# Patient Record
Sex: Female | Born: 1950
Health system: Western US, Academic
[De-identification: ages and names within clinical notes are randomized; demographics above are authoritative.]

## PROBLEM LIST (undated history)

## (undated) DIAGNOSIS — D649 Anemia, unspecified: Secondary | ICD-10-CM

## (undated) DIAGNOSIS — F32A Depression, unspecified: Secondary | ICD-10-CM

## (undated) DIAGNOSIS — N200 Calculus of kidney: Secondary | ICD-10-CM

## (undated) DIAGNOSIS — J449 Chronic obstructive pulmonary disease, unspecified: Secondary | ICD-10-CM

## (undated) DIAGNOSIS — J45909 Unspecified asthma, uncomplicated: Secondary | ICD-10-CM

## (undated) DIAGNOSIS — I471 Supraventricular tachycardia, unspecified: Secondary | ICD-10-CM

## (undated) DIAGNOSIS — E7800 Pure hypercholesterolemia, unspecified: Secondary | ICD-10-CM

## (undated) DIAGNOSIS — Z889 Allergy status to unspecified drugs, medicaments and biological substances status: Secondary | ICD-10-CM

## (undated) DIAGNOSIS — Z8619 Personal history of other infectious and parasitic diseases: Secondary | ICD-10-CM

## (undated) DIAGNOSIS — E119 Type 2 diabetes mellitus without complications: Secondary | ICD-10-CM

## (undated) DIAGNOSIS — R011 Cardiac murmur, unspecified: Secondary | ICD-10-CM

## (undated) DIAGNOSIS — M1990 Unspecified osteoarthritis, unspecified site: Secondary | ICD-10-CM

## (undated) DIAGNOSIS — I10 Essential (primary) hypertension: Secondary | ICD-10-CM

## (undated) DIAGNOSIS — E785 Hyperlipidemia, unspecified: Secondary | ICD-10-CM

## (undated) DIAGNOSIS — Z9109 Other allergy status, other than to drugs and biological substances: Secondary | ICD-10-CM

## (undated) DIAGNOSIS — K219 Gastro-esophageal reflux disease without esophagitis: Secondary | ICD-10-CM

## (undated) DIAGNOSIS — F329 Major depressive disorder, single episode, unspecified: Secondary | ICD-10-CM

## (undated) HISTORY — DX: Supraventricular tachycardia, unspecified: I47.10

## (undated) HISTORY — DX: Unspecified asthma, uncomplicated: J45.909

## (undated) HISTORY — DX: Chronic obstructive pulmonary disease, unspecified: J44.9

## (undated) HISTORY — DX: Supraventricular tachycardia: I47.1

## (undated) HISTORY — DX: Unspecified osteoarthritis, unspecified site: M19.90

## (undated) HISTORY — PX: TUBAL LIGATION: SHX77

## (undated) HISTORY — DX: Other allergy status, other than to drugs and biological substances: Z91.09

## (undated) HISTORY — DX: Pure hypercholesterolemia, unspecified: E78.00

## (undated) HISTORY — DX: Essential (primary) hypertension: I10

## (undated) HISTORY — PX: TONSILLECTOMY: SUR1361

## (undated) HISTORY — PX: WISDOM TOOTH EXTRACTION: SHX21

## (undated) HISTORY — DX: Type 2 diabetes mellitus without complications: E11.9

## (undated) HISTORY — DX: Anemia, unspecified: D64.9

## (undated) HISTORY — DX: Gastro-esophageal reflux disease without esophagitis: K21.9

---

## 1988-08-22 HISTORY — PX: ABDOMINAL HYSTERECTOMY: SHX81

## 1999-06-18 ENCOUNTER — Ambulatory Visit (HOSPITAL_COMMUNITY): Admission: RE | Admit: 1999-06-18 | Discharge: 1999-06-18 | Payer: Self-pay | Admitting: Internal Medicine

## 2005-03-03 ENCOUNTER — Ambulatory Visit: Payer: Self-pay | Admitting: Internal Medicine

## 2005-12-29 ENCOUNTER — Ambulatory Visit: Payer: Self-pay | Admitting: Gastroenterology

## 2006-01-06 ENCOUNTER — Ambulatory Visit: Payer: Self-pay | Admitting: Gastroenterology

## 2006-04-05 ENCOUNTER — Ambulatory Visit: Payer: Self-pay | Admitting: Internal Medicine

## 2006-10-26 ENCOUNTER — Ambulatory Visit: Payer: Self-pay | Admitting: Cardiology

## 2006-11-13 ENCOUNTER — Encounter: Payer: Self-pay | Admitting: Cardiology

## 2006-11-13 ENCOUNTER — Ambulatory Visit: Payer: Self-pay

## 2006-11-13 ENCOUNTER — Encounter: Payer: Self-pay | Admitting: Internal Medicine

## 2006-11-15 ENCOUNTER — Ambulatory Visit: Payer: Self-pay | Admitting: Cardiology

## 2007-04-26 ENCOUNTER — Ambulatory Visit: Payer: Self-pay | Admitting: Internal Medicine

## 2007-06-29 ENCOUNTER — Ambulatory Visit: Payer: Self-pay | Admitting: Internal Medicine

## 2008-05-01 ENCOUNTER — Ambulatory Visit: Payer: Self-pay | Admitting: Internal Medicine

## 2008-05-29 ENCOUNTER — Ambulatory Visit: Payer: Self-pay | Admitting: Cardiology

## 2008-08-22 HISTORY — PX: LAPAROSCOPIC GASTRIC BANDING: SHX1100

## 2009-04-03 ENCOUNTER — Encounter (INDEPENDENT_AMBULATORY_CARE_PROVIDER_SITE_OTHER): Payer: Self-pay | Admitting: *Deleted

## 2009-08-20 ENCOUNTER — Ambulatory Visit: Payer: Self-pay | Admitting: Family Medicine

## 2009-09-05 ENCOUNTER — Ambulatory Visit: Payer: Self-pay

## 2010-10-05 ENCOUNTER — Ambulatory Visit: Payer: Self-pay | Admitting: Internal Medicine

## 2011-01-04 NOTE — Assessment & Plan Note (Signed)
[facility]                            CARDIOLOGY OFFICE NOTE   Glenda Zavala, Glenda Zavala                         MRN:          [id number]  DATE:05/29/2008                            DOB:          May 19, 1951    I was asked by Dr. Marcille Blanco to do a preop clearance on Glenda Zavala.  She is scheduled to have an elective Lap-Band in the near future.   I saw her initially on October 26, 2006, for an abnormal EKG.  At that time  because of her multiple cardiac risk factors including her weight  issues, we did a dobutamine echocardiogram.  This showed no stress-  induced ischemia.  It was a dobutamine-induced stress study.   She also had a history of a heart murmur which sounds like a benign  outflow murmur.  Echo did not show any significant valve disease.   Her past medical history is significant for hypertension, asthma,  hyperlipidemia, and osteoarthritis.   She is having no chest pain.  She does have some mild dyspnea on  exertion but is very limited.   Her meds today include:  1. Spiriva 18 mcg a day.  2. Fluticasone nasal spray daily.  3. Hydrochlorothiazide 25 mg a day.  4. Verapamil 240 mg a day.  5. Lisinopril 40 mg a day.  6. Metformin 1000 mg p.o. b.i.d.  7. Celebrex 200 mg a day.  8. Crestor 10 mg a day.  9. Iron.  10.B12 injection monthly.   She takes Asmanex p.r.n.   PHYSICAL EXAMINATION:  GENERAL:  Today, her blood pressure is 140/76,  pulse 72 and regular, weight is 354.  HEENT:  Grossly normal.  NECK:  Carotid upstrokes were equal bilaterally without bruits.  No JVD.  HEART:  Soft S1 and S2.  PMI could not be appreciated.  She has soft  systolic murmur along the left sternal border.  S2 splits.  LUNGS:  Clear.  ABDOMEN:  Obese.  Organomegaly could not be assessed.  EXTREMITIES:  Chronic edematous changes of the soft tissue, subcutaneous  fat.  Pulses are intact.   Electrocardiogram shows sinus rhythm with a rightward axis, low  voltage,  otherwise normal.   I have cleared Glenda Zavala for surgery.  She has a low operative risk in  my opinion for a Lap-Band procedure.  I have advised her to take her  hydrochlorothiazide, her verapamil, and her lisinopril the morning of  surgery.  If not, she can take it the night before.  She will obviously  need to hold her metformin because of the risk of acidosis.  Her Asmanex  is also important.   I have scheduled her for followup in 12 months to see the results.  She  is excited about having this and I wished her the best of success and  the best of luck.     Strothman C. Daleen Squibb, MD, [facility]  Electronically Signed    TCW/MedQ  DD: 05/29/2008  DT: 05/30/2008  Job #: [id number]   cc:   Lorin Picket A. Bovard, MD in [facility]

## 2011-01-07 NOTE — Assessment & Plan Note (Signed)
[facility]                            CARDIOLOGY OFFICE NOTE   Glenda, Zavala                         MRN:          [id number]  DATE:11/15/2006                            DOB:          [birth year]    Glenda Zavala returns today for followup of her stress dobutamine echo.   She required some atropine to get her heart rate up to target rate.  Her  peak heart rate was 144 beats per minute, blood pressure responded  appropriately.  Her stress images showed no evidence of any stress  induced ischemia.  She had some occasional PVCs and fusion beats  secondary to the dobutamine and atropine.  She did not have any chest  discomfort.   Her medications are unchanged.   I have had a good discussion with Glenda Zavala.  Though she may have some  nonobstructive block, she has no evidence of obstructive disease at this  point.  I have encouraged her to continue with her therapeutic lifestyle  changes and to take her medications.  We will plan on seeing her back on  a p.r.n. basis.     Lupe C. Daleen Squibb, MD, [facility]  Electronically Signed    TCW/MedQ  DD: 11/15/2006  DT: 11/15/2006  Job #: [id number]   cc:   Dale [place]

## 2011-01-07 NOTE — Assessment & Plan Note (Signed)
[facility]                            CARDIOLOGY OFFICE NOTE   SHARONA, ROVNER                         MRN:          [id number]  DATE:10/26/2006                            DOB:          [birth year]    I was asked by Dale [place] to evaluate Glenda Zavala with an abnormal  EKG.   I saw Ms. Glenda Zavala, apparently, 10 years ago.  She said we did a TEE,  which was normal.   She has had no symptoms of ischemia.  She is fairly sedentary, though  she has lost 50 pounds in the past 6 months.  Her risk factors are  diabetes x5 years, hypertension and hyperlipidemia, all being treated  successfully.   She does not smoke.  She weighs 338 pounds.   On recent evaluation by Dr. Lorin Picket, she was found to have poor R wave  progression across the anterior precordium with a PVC.  She has low  voltage and nonspecific ST segment changes otherwise.   PAST MEDICAL HISTORY:  SIGNIFICANT FOR NO KNOWN DYE ALLERGIES.   CURRENT MEDICATIONS:  1. Spiriva 18 mcg a day.  2. Fluticasone 50 mcg a day.  3. Asmanex 220 mcg a day.  4. Hydrochlorothiazide 25 mg a day.  5. Pravachol 40 mg a day.  6. Verapamil 240 mg a day.  7. Lisinopril 40 mg a day.  8. Metformin 1000 p.o. b.i.d.  9. Celebrex 200 mg a day.   She has 1 ounce of caffeinated beverage a day.  She does not drink  alcohol.   She has had a previous C-section x2, hysterectomy and 1 ovary removed.  She has had a tonsillectomy.   FAMILY HISTORY:  Negative for premature heart disease.   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  She is married and has 2 children and 2 grandchildren.   REVIEW OF SYSTEMS:  All systems reviewed and other HPI, positive for  some allergies, hay fever, history of asthma, menstrual dysfunction,  chronic arthritis and chronic bronchitis.   EXAMINATION:  She is extremely pleasant.  She is 5 feet 3 inches and weighs 353 pounds.  Her blood pressure is  132/80.  Pulse is 75 and regular.  HEENT:  Normocephalic and atraumatic.  PERRLA.  Extraocular movements  intact.  Sclerae are clear.  Facial symmetry is normal.  Dentition is  satisfactory.  NECK:  Supple.  Carotid upstrokes are equal bilaterally with probable  referred sounds bilaterally.  There is no JVD.  LUNGS:  Clear to auscultation.  HEART:  Reveals a nondisplaced PMI.  She has a systolic murmur along the  left sternal border that is 2/6.  S2 seems to split, but is difficult to  hear.  ABDOMINAL EXAM:  Obese, making organomegaly assessment impossible.  EXTREMITIES:  Reveal 1+ pitting edema.  She has chronic brawny and post  cellulitis changes in both lower extremities.  Her pulses were 2+/4+  dorsalis pedis, posterior tibial.  NEUROLOGIC:  Grossly intact.  SKIN:  Is otherwise intact and unremarkable.   ASSESSMENT:  1. Abnormal  electrocardiogram with a question of anteroseptal      myocardial infarction.  She has multiple cardiac risk factors,      including hypertension, diabetes, and hyperlipidemia, as well as      obesity.  2. Systolic murmur radiating to the neck.  She probably has some      degree of aortic sclerosis or stenosis.   RECOMMENDATIONS:  Dobutamine stress echo.  During the echo, we can  evaluate the aortic valve as well.  Hopefully, this will give [place] better  images than a Myoview.  I will see her back after the study to discuss  the findings and other suggestions.     [place] C. Daleen Squibb, MD, [facility]  Electronically Signed    TCW/MedQ  DD: 10/26/2006  DT: 10/26/2006  Job #: [id number]   cc:   Dale [place]

## 2012-02-14 ENCOUNTER — Ambulatory Visit: Payer: Self-pay | Admitting: Internal Medicine

## 2012-02-28 ENCOUNTER — Ambulatory Visit: Payer: Self-pay | Admitting: Cardiology

## 2012-06-13 ENCOUNTER — Telehealth: Payer: Self-pay | Admitting: Internal Medicine

## 2012-06-13 NOTE — Telephone Encounter (Signed)
Pt called to make appointment gave her 09/25/12.  Pt stated she is having a lot of problems with acid reflux.  She was up every hour last night throwing up acid.

## 2012-06-13 NOTE — Telephone Encounter (Signed)
If she was up all night and up every hour - needs eval now and then I can follow up afterwards.  Rec to Acute care for eval.  Thanks.

## 2012-06-14 NOTE — Telephone Encounter (Signed)
Left message on cell for patient to return call.  

## 2012-06-15 NOTE — Telephone Encounter (Signed)
Patient states that she is no better and that she is still having trouble sleeping. Patient is waiting for her appointment.

## 2012-06-19 NOTE — Telephone Encounter (Signed)
I can see her at 2:00 on 06/26/12.  If problems before or acute change - rec evaluation earlier.  Thanks.  Please schedule an appt.

## 2012-06-19 NOTE — Telephone Encounter (Signed)
This message was originally from 06/13/12.  I had messaged back and stated if up every night - to acute care for eval.  Per 06/15/12 message - states pt is waiting for appt.  Is she waiting on an appt with me?  Did she go to acute care?  Is she taking anything now for acid reflux?

## 2012-06-19 NOTE — Telephone Encounter (Signed)
Called patient at home to reevaluate her symptoms. Patient states that her condition is the same and she would like to see you as soon as you can work her in.

## 2012-06-20 ENCOUNTER — Telehealth: Payer: Self-pay | Admitting: Internal Medicine

## 2012-06-20 NOTE — Telephone Encounter (Signed)
Refill request for spiriva handihaler 18 mcg inh cap #30 Sig: inhale contents of one capsule once a day as directed Patient has an appointment on 09/27/12

## 2012-06-20 NOTE — Telephone Encounter (Signed)
Left message for patient to return call.

## 2012-06-20 NOTE — Telephone Encounter (Signed)
Left message for pt to call office

## 2012-06-21 NOTE — Telephone Encounter (Signed)
Dr. Lorin Picket called prescription into pharmacy.

## 2012-06-22 ENCOUNTER — Other Ambulatory Visit: Payer: Self-pay | Admitting: *Deleted

## 2012-06-22 MED ORDER — TIOTROPIUM BROMIDE MONOHYDRATE 18 MCG IN CAPS: 18.0000 ug | ORAL_CAPSULE | Freq: Every day | RESPIRATORY_TRACT | Status: DC

## 2012-06-22 NOTE — Telephone Encounter (Signed)
Left message for pt to call office

## 2012-06-22 NOTE — Telephone Encounter (Signed)
Left message for pt to call office dr Lorin Picket would like to see her 11/4 @ 11:30

## 2012-06-22 NOTE — Telephone Encounter (Signed)
Prescription faxed in to pharmacy and patient notified.

## 2012-06-25 NOTE — Telephone Encounter (Signed)
When you are able - please put her in the 06/28/12 appt - at 8:30.  Thanks.

## 2012-06-25 NOTE — Telephone Encounter (Signed)
Pt is in class today. Monday is not a good day. Any other day is ok Pt still having problems with gerd.   Pt has tried nexium and protonix  & Tagamet Pt is now taking oprazlam for 4 days this is helping a little

## 2012-06-25 NOTE — Telephone Encounter (Signed)
Left message for pt to call office

## 2012-06-26 NOTE — Telephone Encounter (Signed)
Pt aware of appointment 11/7 @ 8:30

## 2012-06-27 ENCOUNTER — Encounter: Payer: Self-pay | Admitting: Internal Medicine

## 2012-06-28 ENCOUNTER — Ambulatory Visit (INDEPENDENT_AMBULATORY_CARE_PROVIDER_SITE_OTHER): Payer: PRIVATE HEALTH INSURANCE | Admitting: Internal Medicine

## 2012-06-28 ENCOUNTER — Encounter: Payer: Self-pay | Admitting: Internal Medicine

## 2012-06-28 VITALS — BP 128/78 | HR 55 | Temp 99.1°F | Ht 64.0 in | Wt 240.0 lb

## 2012-06-28 DIAGNOSIS — I10 Essential (primary) hypertension: Secondary | ICD-10-CM

## 2012-06-28 DIAGNOSIS — E119 Type 2 diabetes mellitus without complications: Secondary | ICD-10-CM

## 2012-06-28 DIAGNOSIS — J45909 Unspecified asthma, uncomplicated: Secondary | ICD-10-CM

## 2012-06-28 DIAGNOSIS — M1990 Unspecified osteoarthritis, unspecified site: Secondary | ICD-10-CM

## 2012-06-28 DIAGNOSIS — K219 Gastro-esophageal reflux disease without esophagitis: Secondary | ICD-10-CM

## 2012-06-28 DIAGNOSIS — D649 Anemia, unspecified: Secondary | ICD-10-CM

## 2012-06-28 DIAGNOSIS — F329 Major depressive disorder, single episode, unspecified: Secondary | ICD-10-CM

## 2012-06-28 DIAGNOSIS — E7800 Pure hypercholesterolemia, unspecified: Secondary | ICD-10-CM

## 2012-06-28 DIAGNOSIS — M129 Arthropathy, unspecified: Secondary | ICD-10-CM

## 2012-06-28 NOTE — Patient Instructions (Addendum)
It was nice seeing you today.  I am sorry you have not been feeling well.  I want you to continue to take your omeprazole 30 minutes before breakfast and add an omeprazole 30 minutes before your evening meal.  Continue ranitidine at night.  We will get an appt scheduled with Dr Marva Panda.

## 2012-07-13 ENCOUNTER — Telehealth: Payer: Self-pay | Admitting: Internal Medicine

## 2012-07-13 LAB — HM DIABETES EYE EXAM

## 2012-07-13 NOTE — Telephone Encounter (Signed)
crestor 5mg  tab #90 Take one tablet daily at bedtime

## 2012-07-13 NOTE — Telephone Encounter (Signed)
Med refill Hydrochlorothiazide 25mg  tab #90 Take one tablet by mouth every day [facility] pharmacy

## 2012-07-15 ENCOUNTER — Encounter: Payer: Self-pay | Admitting: Internal Medicine

## 2012-07-15 ENCOUNTER — Telehealth: Payer: Self-pay | Admitting: Internal Medicine

## 2012-07-15 DIAGNOSIS — I10 Essential (primary) hypertension: Secondary | ICD-10-CM | POA: Insufficient documentation

## 2012-07-15 DIAGNOSIS — M1990 Unspecified osteoarthritis, unspecified site: Secondary | ICD-10-CM | POA: Insufficient documentation

## 2012-07-15 DIAGNOSIS — E7800 Pure hypercholesterolemia, unspecified: Secondary | ICD-10-CM | POA: Insufficient documentation

## 2012-07-15 DIAGNOSIS — F320 Major depressive disorder, single episode, mild: Secondary | ICD-10-CM | POA: Insufficient documentation

## 2012-07-15 DIAGNOSIS — D649 Anemia, unspecified: Secondary | ICD-10-CM | POA: Insufficient documentation

## 2012-07-15 DIAGNOSIS — E119 Type 2 diabetes mellitus without complications: Secondary | ICD-10-CM | POA: Insufficient documentation

## 2012-07-15 DIAGNOSIS — M129 Arthropathy, unspecified: Secondary | ICD-10-CM | POA: Insufficient documentation

## 2012-07-15 DIAGNOSIS — J45909 Unspecified asthma, uncomplicated: Secondary | ICD-10-CM | POA: Insufficient documentation

## 2012-07-15 DIAGNOSIS — K219 Gastro-esophageal reflux disease without esophagitis: Secondary | ICD-10-CM | POA: Insufficient documentation

## 2012-07-15 DIAGNOSIS — F32A Depression, unspecified: Secondary | ICD-10-CM | POA: Insufficient documentation

## 2012-07-15 DIAGNOSIS — F329 Major depressive disorder, single episode, unspecified: Secondary | ICD-10-CM | POA: Insufficient documentation

## 2012-07-15 MED ORDER — HYDROCHLOROTHIAZIDE 25 MG PO TABS: 25.0000 mg | ORAL_TABLET | Freq: Every day | ORAL | Status: DC

## 2012-07-15 MED ORDER — ROSUVASTATIN CALCIUM 5 MG PO TABS: 5.0000 mg | ORAL_TABLET | Freq: Every day | ORAL | Status: DC

## 2012-07-15 NOTE — Assessment & Plan Note (Signed)
With worsening acid reflux, dysphagia and is s/p lap band - feels she needs GI evaluation.  Will refer back to Dr Marva Panda for evaluation for possible EGD.  Will increase prilosec to bid - take one before breakfast and one before supper.  Take the Ranitidine before bed.  Avoid eating late and avoid foods that aggravate.

## 2012-07-15 NOTE — Telephone Encounter (Signed)
rx sent to [facility] for crestor 5mg  #90 with 3 refills and hctz #90 with 3 refills.

## 2012-07-15 NOTE — Progress Notes (Signed)
Subjective:    Patient ID: Glenda Zavala, female    DOB: 10/09/1950, 61 y.o.   MRN: [id number]  HPI 61 year old female with past history of hypertension, diabetes, SVT and pulmonary function testing that revealed changes consistent with emphysema.  She comes in today for a scheduled follow up.  She has been having increased problems with acid reflux.  She is s/p lap band.  Describes a burning sensation and decreased appetite.  Has adjusted her meals.  Feels as if food is getting stuck.  Symptoms worse at night.  She also had noticed increased drainage and cough.  Some occasional emesis related to this.  Taking an antihistamine at night to try to help with the increased drainage.  No drainage during the day.  Will feel acid up in her throat.  She is taking Omeprazole in the am.  Has seen Dr Marva Panda in the past.    She feels her breathing is stable.  Saw Dr Meredeth Ide.  Diagnosed with asthma.  Tried to adjust her medication for acid reflux.  Saw Dr Lady Gary.  Right heart cath was negative.  No chest pain or tightness.  Bowels stable.   Past Medical History  Diagnosis Date  . Hypertension   . Hypercholesterolemia   . Asthma   . Environmental allergies   . PSVT (paroxysmal supraventricular tachycardia)   . GERD (gastroesophageal reflux disease)   . Diabetes mellitus   . COPD (chronic obstructive pulmonary disease)   . Anemia   . Arthritis     Review of Systems Patient denies any headache, lightheadedness or dizziness.  Increased drainage as outlined.  No sinus pressure.  No chest pain, tightness or palpitations.  No increased shortness of breath.  drainage mostly a problem at night.  Increased acid reflux as outlined.  Decreased appetite.  Continued weight loss.  Some dysphagia.  Is s/p lab band.   Some emesis with the increased drainage.   No abdominal pain or cramping.  No bowel change, such as diarrhea, constipation, BRBPR or melana.  No urine change.        Objective:   Physical Exam Filed  Vitals:   06/28/12 0824  BP: 128/78  Pulse: 55  Temp: 99.1 F (40.40 C)   61 year old female in no acute distress.   HEENT:  Nares - clear.  OP- without lesions or erythema.  NECK:  Supple, nontender.  No audible bruit.   HEART:  Appears to be regular. LUNGS:  Without crackles or wheezing audible.  Respirations even and unlabored.   RADIAL PULSE:  Equal bilaterally.  ABDOMEN:  Soft, nontender.  No audible abdominal bruit.   EXTREMITIES:  No increased edema to be present.  Stable/improved.  No increased erythema or warmth.                  Assessment & Plan:  INCREASED DRAINAGE.  Thick drainage.  Bothers her at night.  Can continue Flonase and saline nasal flushes as directed.  I feel this is being aggravated by the worsening acid reflux.  Will treat acid reflux and see if her drainage improves.  Do not feel she needs antibiotics.    CARDIOVASCULAR.  Currently symptoms stable.  Recent right heart catheterization negative.   GI.  Colonoscopy 12/29/05.  She thinks she is due follow up.  With the increased acid reflux, dysphagia and recent lap band procedure - needs GI evaluation for possible EGD.  Treat acid reflux as outlined.  Follow.  She has  also had increased weight loss.  She is s/p lab band and has adjusted her diet.  With the increased acid reflux problems, she has also not been able to eat as well.  Again refer to GI for evaluation.    HEALTH MAINTENANCE.  Physical 01/11/12.  States she had her mammogram 6/13.  Obtain results.  Colonoscopy - above.  Refer to GI.

## 2012-07-15 NOTE — Assessment & Plan Note (Signed)
Has been seeing Dr Meredeth Ide.  Continue current inhaler regimen.  Breathing currently stable.

## 2012-07-15 NOTE — Assessment & Plan Note (Signed)
Has been seeing ortho.  Continue home exercise.  Needs to stop the celebrex especially with the current GI issues.  Use Tylenol.

## 2012-07-15 NOTE — Assessment & Plan Note (Signed)
On Crestor.  Has lost weight.  Cholesterol panel 05/25/12 revealed total cholesterol 155, triglycerides 62, HDL 52 and LDL91.  Liver panel wnl.  Same meds.

## 2012-07-15 NOTE — Assessment & Plan Note (Signed)
Has lost weight.  Continue same meds.  A1c 05/25/12 - 6.4.  Follow.

## 2012-07-15 NOTE — Assessment & Plan Note (Signed)
Blood pressure has been under good control.  Same meds.  Recent metabolic panel wnl.

## 2012-07-15 NOTE — Assessment & Plan Note (Signed)
Currently stable.  On Celexa.  Follow.

## 2012-07-15 NOTE — Assessment & Plan Note (Signed)
Hgb 05/25/12 - 10.8.  Stable.  Follow.  Remains on iron.  Follow.

## 2012-07-23 DIAGNOSIS — D649 Anemia, unspecified: Secondary | ICD-10-CM

## 2012-08-09 ENCOUNTER — Telehealth: Payer: Self-pay | Admitting: Internal Medicine

## 2012-08-09 NOTE — Telephone Encounter (Addendum)
One Touch Verio   Test blood glucose twice daily   # 50  Spirva Hanihaler 18 mcg inh cap  Inhale contents of one capsule once a day as directed  #30  Lisinopril 40 mg tab  Take one tablet by mouth every morning for blood pressure  #90   Verapamil HCL ER 240 mg TER   Taker one tablet each day  # 90

## 2012-08-13 MED ORDER — GLUCOSE BLOOD VI STRP: 1.0000 | ORAL_STRIP | Freq: Two times a day (BID) | Status: DC | PRN

## 2012-08-13 MED ORDER — VERAPAMIL HCL ER 240 MG PO TBCR: 240.0000 mg | EXTENDED_RELEASE_TABLET | Freq: Every day | ORAL | Status: DC

## 2012-08-13 MED ORDER — TIOTROPIUM BROMIDE MONOHYDRATE 18 MCG IN CAPS: 18.0000 ug | ORAL_CAPSULE | Freq: Every day | RESPIRATORY_TRACT | Status: DC

## 2012-08-13 MED ORDER — LISINOPRIL 40 MG PO TABS: 40.0000 mg | ORAL_TABLET | Freq: Every day | ORAL | Status: DC

## 2012-08-13 NOTE — Telephone Encounter (Signed)
Called prescription in to pharmacy 

## 2012-09-14 ENCOUNTER — Ambulatory Visit: Payer: Self-pay | Admitting: Gastroenterology

## 2012-09-14 DIAGNOSIS — K3189 Other diseases of stomach and duodenum: Secondary | ICD-10-CM | POA: Diagnosis not present

## 2012-09-14 DIAGNOSIS — R634 Abnormal weight loss: Secondary | ICD-10-CM | POA: Diagnosis not present

## 2012-09-14 DIAGNOSIS — R110 Nausea: Secondary | ICD-10-CM | POA: Diagnosis not present

## 2012-09-14 DIAGNOSIS — R1013 Epigastric pain: Secondary | ICD-10-CM | POA: Diagnosis not present

## 2012-09-14 DIAGNOSIS — K219 Gastro-esophageal reflux disease without esophagitis: Secondary | ICD-10-CM | POA: Diagnosis not present

## 2012-09-18 ENCOUNTER — Telehealth: Payer: Self-pay | Admitting: Internal Medicine

## 2012-09-18 NOTE — Telephone Encounter (Signed)
Left message for patient to return call.

## 2012-09-18 NOTE — Telephone Encounter (Signed)
Is she seeing her surgeon for this problem.  Does she need me to do anything.

## 2012-09-18 NOTE — Telephone Encounter (Signed)
Pt calling to inform Dr. Lorin Picket that her lap band has slipped and is causing severe acid reflux.  Pt has new pt appt 2/6 but is a pt of Dr. Roby Lofts from [place].  Pt informing Dr. Lorin Picket that she is to see Dr. Bufford Lope 2/5 and has had upper GI done; can call Christy Sartorius at [facility] [phone] with questions.

## 2012-09-21 NOTE — Telephone Encounter (Signed)
Called and left message for patient to call office;no answer and left message

## 2012-09-24 DIAGNOSIS — R1013 Epigastric pain: Secondary | ICD-10-CM | POA: Diagnosis not present

## 2012-09-24 DIAGNOSIS — K3189 Other diseases of stomach and duodenum: Secondary | ICD-10-CM | POA: Diagnosis not present

## 2012-09-26 NOTE — Telephone Encounter (Signed)
Patient stated that everything is fine.

## 2012-09-27 ENCOUNTER — Encounter: Payer: Self-pay | Admitting: Internal Medicine

## 2012-09-27 ENCOUNTER — Ambulatory Visit (INDEPENDENT_AMBULATORY_CARE_PROVIDER_SITE_OTHER): Payer: Medicare Other | Admitting: Internal Medicine

## 2012-09-27 VITALS — BP 124/70 | HR 63 | Temp 98.6°F | Ht 64.0 in | Wt 230.0 lb

## 2012-09-27 DIAGNOSIS — Z124 Encounter for screening for malignant neoplasm of cervix: Secondary | ICD-10-CM | POA: Insufficient documentation

## 2012-09-27 DIAGNOSIS — M1990 Unspecified osteoarthritis, unspecified site: Secondary | ICD-10-CM

## 2012-09-27 DIAGNOSIS — I10 Essential (primary) hypertension: Secondary | ICD-10-CM

## 2012-09-27 DIAGNOSIS — E119 Type 2 diabetes mellitus without complications: Secondary | ICD-10-CM

## 2012-09-27 DIAGNOSIS — F329 Major depressive disorder, single episode, unspecified: Secondary | ICD-10-CM

## 2012-09-27 DIAGNOSIS — D649 Anemia, unspecified: Secondary | ICD-10-CM

## 2012-09-27 DIAGNOSIS — Z1151 Encounter for screening for human papillomavirus (HPV): Secondary | ICD-10-CM | POA: Insufficient documentation

## 2012-09-27 DIAGNOSIS — K219 Gastro-esophageal reflux disease without esophagitis: Secondary | ICD-10-CM

## 2012-09-27 DIAGNOSIS — M129 Arthropathy, unspecified: Secondary | ICD-10-CM

## 2012-09-27 DIAGNOSIS — E7800 Pure hypercholesterolemia, unspecified: Secondary | ICD-10-CM

## 2012-09-27 DIAGNOSIS — Z139 Encounter for screening, unspecified: Secondary | ICD-10-CM

## 2012-09-27 DIAGNOSIS — J45909 Unspecified asthma, uncomplicated: Secondary | ICD-10-CM

## 2012-09-27 LAB — CBC WITH DIFFERENTIAL/PLATELET
Basophils Absolute: 0 10*3/uL (ref 0.0–0.1)
Eosinophils Absolute: 0.4 10*3/uL (ref 0.0–0.7)
Hemoglobin: 9.9 g/dL — ABNORMAL LOW (ref 12.0–15.0)
Lymphocytes Relative: 21.7 % (ref 12.0–46.0)
Lymphs Abs: 1.7 10*3/uL (ref 0.7–4.0)
MCHC: 31.9 g/dL (ref 30.0–36.0)
Monocytes Relative: 8.8 % (ref 3.0–12.0)
Neutro Abs: 5.1 10*3/uL (ref 1.4–7.7)
Platelets: 315 10*3/uL (ref 150.0–400.0)
RDW: 15.5 % — ABNORMAL HIGH (ref 11.5–14.6)

## 2012-09-27 LAB — LIPID PANEL
Cholesterol: 185 mg/dL (ref 0–200)
LDL Cholesterol: 119 mg/dL — ABNORMAL HIGH (ref 0–99)
Triglycerides: 57 mg/dL (ref 0.0–149.0)

## 2012-09-27 LAB — HEPATIC FUNCTION PANEL
ALT: 11 U/L (ref 0–35)
AST: 8 U/L (ref 0–37)
Albumin: 3.6 g/dL (ref 3.5–5.2)
Alkaline Phosphatase: 54 U/L (ref 39–117)
Bilirubin, Direct: 0.1 mg/dL (ref 0.0–0.3)
Total Protein: 6.3 g/dL (ref 6.0–8.3)

## 2012-09-27 LAB — BASIC METABOLIC PANEL
BUN: 21 mg/dL (ref 6–23)
Chloride: 101 mEq/L (ref 96–112)
Creatinine, Ser: 0.8 mg/dL (ref 0.4–1.2)
GFR: 80.29 mL/min (ref >60.00)

## 2012-09-27 LAB — FERRITIN: Ferritin: 5.2 ng/mL — ABNORMAL LOW (ref 10.0–291.0)

## 2012-09-27 MED ORDER — CITALOPRAM HYDROBROMIDE 20 MG PO TABS: ORAL_TABLET | ORAL | Status: DC

## 2012-09-29 ENCOUNTER — Encounter: Payer: Self-pay | Admitting: Internal Medicine

## 2012-09-29 NOTE — Progress Notes (Signed)
  Subjective:    Patient ID: Glenda Zavala, female    DOB: 03-03-1951, 62 y.o.   MRN: [id number]  HPI 62 year old female with past history of hypertension, diabetes, SVT and pulmonary function testing that revealed changes consistent with emphysema.  She comes in today for a scheduled follow up.  She had been having increased emesis and inability to eat.  She saw Dr Imogene Burn.  Her lap band had slipped.  States she had all her fluid removed and symptoms immediately improved.  Feels better.  Able to eat now.  No problems swallowing.  No acid reflux.  No nausea or vomiting now.  Bowels stable.  She feels her breathing is stable.  Saw Dr Meredeth Ide.  Diagnosed with asthma.  Has seen Dr Lady Gary.  Right heart cath was negative.  No chest pain or tightness.     Past Medical History  Diagnosis Date  . Hypertension   . Hypercholesterolemia   . Asthma   . Environmental allergies   . PSVT (paroxysmal supraventricular tachycardia)   . GERD (gastroesophageal reflux disease)   . Diabetes mellitus   . COPD (chronic obstructive pulmonary disease)   . Anemia   . Arthritis     Review of Systems Patient denies any headache, lightheadedness or dizziness.  No sinus pressure.  No chest pain, tightness or palpitations.  No increased shortness of breath.   No acid reflux now.  No nauaea or vomiting now.  Continues to follow up with surgery regarding her lap band.  No abdominal pain.  Bowels stable.  States her blood pressure has been doing well.  Has seen Dr Gentry Fitz (11/13) - for her eyes.         Objective:   Physical Exam  Filed Vitals:   09/27/12 0805  BP: 124/70  Pulse: 63  Temp: 98.6 F (21 C)   62 year old female in no acute distress.   HEENT:  Nares - clear.  OP- without lesions or erythema.  NECK:  Supple, nontender.  No audible bruit.   HEART:  Appears to be regular. LUNGS:  Without crackles or wheezing audible.  Respirations even and unlabored.   RADIAL PULSE:  Equal bilaterally.  ABDOMEN:  Soft,  nontender.  No audible abdominal bruit.   EXTREMITIES:  No increased edema to be present.  Stable/improved.  No increased erythema or warmth.                  Assessment & Plan:  CARDIOVASCULAR.  Currently symptoms stable.  Recent right heart catheterization negative.   GI.  Colonoscopy 12/29/05.  Should be seeing GI regarding follow up.  Is s/p lap band.  Recently slipped.  Fluid removed.  Doing better now.  Eating.  No problems swallowing.  Continues follow up with surgery.      HEALTH MAINTENANCE.  Physical 01/11/12.  States she had her mammogram 6/13.  Obtain results.  Colonoscopy - above.  Referred to GI.

## 2012-09-29 NOTE — Assessment & Plan Note (Signed)
Recheck cbc to confirm stable.  

## 2012-09-29 NOTE — Assessment & Plan Note (Signed)
Stable

## 2012-09-29 NOTE — Assessment & Plan Note (Signed)
Sugars have been under good control.  Will check metabolic panel and a1c.  Up to date with eye checks.  Saw Dr Gentry Fitz 11/13.

## 2012-09-29 NOTE — Assessment & Plan Note (Signed)
Blood pressure under good control.  Same medication regimen.  Follow.  Check metabolic panel.   

## 2012-09-29 NOTE — Assessment & Plan Note (Signed)
Breathing stable.  Follow.    

## 2012-09-29 NOTE — Assessment & Plan Note (Signed)
Symptoms doing well now.   See above.  Continues on omeprazole.  Follow.   

## 2012-09-29 NOTE — Assessment & Plan Note (Signed)
Continues on crestor.  Low choelsterol diet and exercise.  Check lipid profile and liver function.

## 2012-09-29 NOTE — Assessment & Plan Note (Signed)
Still with increased stress.  Mother-n-law living with her.  Has alzheimers.  Feels she may need to increased her citalopram.  Will increased to 10mg  - 1 1/2 tablets q day.  Follow.  Notify me if she needs something more.

## 2012-10-02 ENCOUNTER — Encounter: Payer: Self-pay | Admitting: *Deleted

## 2012-10-03 ENCOUNTER — Other Ambulatory Visit (HOSPITAL_COMMUNITY)
Admission: RE | Admit: 2012-10-03 | Discharge: 2012-10-03 | Disposition: A | Discharge status: Home / Self Care | Payer: No Typology Code available for payment source | Source: Ambulatory Visit | Attending: Internal Medicine | Admitting: Internal Medicine

## 2012-10-04 ENCOUNTER — Encounter: Payer: Self-pay | Admitting: Internal Medicine

## 2012-10-12 ENCOUNTER — Other Ambulatory Visit: Payer: Self-pay | Admitting: Internal Medicine

## 2012-10-15 NOTE — Telephone Encounter (Signed)
Sent in to pharmacy.  

## 2012-11-17 ENCOUNTER — Other Ambulatory Visit: Payer: Self-pay | Admitting: Internal Medicine

## 2012-11-21 NOTE — Telephone Encounter (Signed)
Rx sent in to pharmacy. 

## 2013-01-01 ENCOUNTER — Ambulatory Visit: Payer: Self-pay | Admitting: Gastroenterology

## 2013-01-01 DIAGNOSIS — Z79899 Other long term (current) drug therapy: Secondary | ICD-10-CM | POA: Diagnosis not present

## 2013-01-01 DIAGNOSIS — I10 Essential (primary) hypertension: Secondary | ICD-10-CM | POA: Diagnosis not present

## 2013-01-01 DIAGNOSIS — K219 Gastro-esophageal reflux disease without esophagitis: Secondary | ICD-10-CM | POA: Diagnosis not present

## 2013-01-01 DIAGNOSIS — D1779 Benign lipomatous neoplasm of other sites: Secondary | ICD-10-CM | POA: Diagnosis not present

## 2013-01-01 DIAGNOSIS — K5730 Diverticulosis of large intestine without perforation or abscess without bleeding: Secondary | ICD-10-CM | POA: Diagnosis not present

## 2013-01-01 DIAGNOSIS — Q438 Other specified congenital malformations of intestine: Secondary | ICD-10-CM | POA: Diagnosis not present

## 2013-01-01 DIAGNOSIS — G4730 Sleep apnea, unspecified: Secondary | ICD-10-CM | POA: Diagnosis not present

## 2013-01-01 DIAGNOSIS — E669 Obesity, unspecified: Secondary | ICD-10-CM | POA: Diagnosis not present

## 2013-01-01 DIAGNOSIS — Z1211 Encounter for screening for malignant neoplasm of colon: Secondary | ICD-10-CM | POA: Diagnosis not present

## 2013-01-17 ENCOUNTER — Ambulatory Visit (INDEPENDENT_AMBULATORY_CARE_PROVIDER_SITE_OTHER): Payer: Medicare Other | Admitting: Internal Medicine

## 2013-01-17 ENCOUNTER — Encounter: Payer: Self-pay | Admitting: Internal Medicine

## 2013-01-17 VITALS — BP 120/80 | HR 56 | Temp 98.5°F | Ht 61.25 in | Wt 264.2 lb

## 2013-01-17 DIAGNOSIS — M25569 Pain in unspecified knee: Secondary | ICD-10-CM

## 2013-01-17 DIAGNOSIS — I10 Essential (primary) hypertension: Secondary | ICD-10-CM | POA: Diagnosis not present

## 2013-01-17 DIAGNOSIS — D649 Anemia, unspecified: Secondary | ICD-10-CM

## 2013-01-17 DIAGNOSIS — E119 Type 2 diabetes mellitus without complications: Secondary | ICD-10-CM | POA: Diagnosis not present

## 2013-01-17 DIAGNOSIS — Z9884 Bariatric surgery status: Secondary | ICD-10-CM

## 2013-01-17 DIAGNOSIS — F329 Major depressive disorder, single episode, unspecified: Secondary | ICD-10-CM

## 2013-01-17 DIAGNOSIS — E7800 Pure hypercholesterolemia, unspecified: Secondary | ICD-10-CM

## 2013-01-17 DIAGNOSIS — K219 Gastro-esophageal reflux disease without esophagitis: Secondary | ICD-10-CM

## 2013-01-17 DIAGNOSIS — M1990 Unspecified osteoarthritis, unspecified site: Secondary | ICD-10-CM

## 2013-01-17 DIAGNOSIS — M129 Arthropathy, unspecified: Secondary | ICD-10-CM

## 2013-01-17 MED ORDER — NYSTATIN 100000 UNIT/GM EX POWD: Freq: Two times a day (BID) | CUTANEOUS | Status: DC

## 2013-01-17 MED ORDER — NYSTATIN 100000 UNIT/GM EX CREA: TOPICAL_CREAM | Freq: Two times a day (BID) | CUTANEOUS | Status: DC

## 2013-01-20 ENCOUNTER — Encounter: Payer: Self-pay | Admitting: Internal Medicine

## 2013-01-20 DIAGNOSIS — Z9889 Other specified postprocedural states: Secondary | ICD-10-CM | POA: Insufficient documentation

## 2013-01-20 DIAGNOSIS — Z9884 Bariatric surgery status: Secondary | ICD-10-CM | POA: Insufficient documentation

## 2013-01-20 NOTE — Assessment & Plan Note (Signed)
Still with increased stress.  Mother-n-law living with her.  Has alzheimers. On Celexa 10mg  - 1 1/2 tablets q day.  Follow.  Notify me if she needs something more.  Discussed at length with her today.

## 2013-01-20 NOTE — Assessment & Plan Note (Signed)
Doing better now.  Eating without problems now.  No acid reflux.  Needs follow up with Dr Bufford Lope.

## 2013-01-20 NOTE — Assessment & Plan Note (Signed)
Blood pressure under good control.  Same medication regimen.  Follow.  Check metabolic panel.   

## 2013-01-20 NOTE — Assessment & Plan Note (Signed)
Recheck cbc to confirm stable.  Colonoscopy as outlined.

## 2013-01-20 NOTE — Assessment & Plan Note (Signed)
Sugars have been under good control.  Will check metabolic panel and a1c.  Up to date with eye checks.  Saw Dr Gentry Fitz 11/13.

## 2013-01-20 NOTE — Assessment & Plan Note (Signed)
Joints doing  better in warm weather.  Off Celebrex.  Desires referral to Dr Ernest Pine for evaluation of her chronic knee pain.

## 2013-01-20 NOTE — Assessment & Plan Note (Signed)
Symptoms doing well now.   See above.  Continues on omeprazole.  Follow.   

## 2013-01-20 NOTE — Progress Notes (Signed)
Subjective:    Patient ID: Glenda Zavala, female    DOB: 1951-02-17, 62 y.o.   MRN: [id number]  HPI 62 year old female with past history of hypertension, diabetes, SVT and pulmonary function testing that revealed changes consistent with emphysema.  She comes in today to follow up on these issues as well as for a complete physical exam.   She had been having increased emesis and inability to eat. She saw Dr Imogene Burn.  Her lap band had slipped.  States she had all her fluid removed and symptoms immediately improved.  Feels better.  Able to eat now.  No problems swallowing.  No acid reflux.  No nausea or vomiting.  Has had no problems lately.  Bowels stable.  She feels her breathing is stable.  Saw Dr Meredeth Ide.  Diagnosed with asthma.  Has seen Dr Lady Gary.  Right heart cath was negative.  No chest pain or tightness.  Needs a follow up appt with Dr Bufford Lope.  She is off her celebrex.  Joints do better when the weather is warm.  Ready to see Dr Ernest Pine regarding her knee.  Her sugars have been doing well.  AM sugars - 90s.  PM sugars - 80s.      Past Medical History  Diagnosis Date  . Hypertension   . Hypercholesterolemia   . Asthma   . Environmental allergies   . PSVT (paroxysmal supraventricular tachycardia)   . GERD (gastroesophageal reflux disease)   . Diabetes mellitus   . COPD (chronic obstructive pulmonary disease)   . Anemia   . Arthritis     Current Outpatient Prescriptions on File Prior to Visit  Medication Sig Dispense Refill  . acetaminophen (TYLENOL) 650 MG CR tablet Take 650 mg by mouth every 8 (eight) hours as needed.      . citalopram (CELEXA) 20 MG tablet Take 1 1/2 tablet q day  45 tablet  3  . cyanocobalamin (,VITAMIN B-12,) 1000 MCG/ML injection Inject 1,000 mcg into the muscle every 30 (thirty) days.      . Fe Fum-FePoly-Vit C-Vit B3 (INTEGRA) 62.5-62.5-40-3 MG CAPS TAKE ONE CAPSULE BY MOUTH DAILY  30 each  5  . fluticasone (FLONASE) 50 MCG/ACT nasal spray Place 2 sprays into the  nose daily.      Marland Kitchen glucose blood test strip 1 each by Other route 2 (two) times daily as needed for other.  50 each  0  . hydrochlorothiazide (HYDRODIURIL) 25 MG tablet Take 1 tablet (25 mg total) by mouth daily.  90 tablet  3  . levalbuterol (XOPENEX HFA) 45 MCG/ACT inhaler Inhale 2 puffs into the lungs every 6 (six) hours as needed.      . rosuvastatin (CRESTOR) 5 MG tablet Take 1 tablet (5 mg total) by mouth at bedtime.  90 tablet  3  . tiotropium (SPIRIVA HANDIHALER) 18 MCG inhalation capsule Place 1 capsule (18 mcg total) into inhaler and inhale daily.  30 capsule  1  . verapamil (CALAN-SR) 240 MG CR tablet Take 1 tablet (240 mg total) by mouth daily.  90 tablet  0  . celecoxib (CELEBREX) 200 MG capsule Take 200 mg by mouth 2 (two) times daily.      Marland Kitchen FeFum-FePo-FA-B Cmp-C-Zn-Mn-Cu (TANDEM PLUS) 162-115.2-1 MG CAPS Take one a day      . ferrous fumarate-iron polysaccharide complex (TANDEM) 162-115.2 MG CAPS Take 1 capsule by mouth daily with breakfast.      . HYDROcodone-acetaminophen (NORCO/VICODIN) 5-325 MG per tablet Take 1 tablet by  mouth daily as needed.      . montelukast (SINGULAIR) 10 MG tablet Take 10 mg by mouth daily.       No current facility-administered medications on file prior to visit.    Review of Systems Patient denies any headache, lightheadedness or dizziness.  No sinus pressure.  No chest pain, tightness or palpitations.  No increased shortness of breath.   No acid reflux now.  No nauaea or vomiting now.  Needs a follow up with surgery regarding her lap band.  No abdominal pain.  Bowels stable.  States her blood pressure has been doing well.  Has seen Dr Gentry Fitz (11/13) - for her eyes. Sugars as outlined.  Joints better in warm weather.  Desires referral to Dr Ernest Pine regarding her knee.         Objective:   Physical Exam  Filed Vitals:   01/17/13 1050  BP: 120/80  Pulse: 56  Temp: 98.5 F (36.9 C)   Pulse 54  62 year old female in no acute distress.   HEENT:   Nares- clear.  Oropharynx - without lesions. NECK:  Supple.  Nontender.  No audible bruit.  HEART:  Appears to be regular. LUNGS:  No crackles or wheezing audible.  Respirations even and unlabored.  RADIAL PULSE:  Equal bilaterally.    BREASTS:  No nipple discharge or nipple retraction present.  Could not appreciate any distinct nodules or axillary adenopathy.  ABDOMEN:  Soft, nontender.  Bowel sounds present and normal.  No audible abdominal bruit.  GU:  She declined pap since done last year.     EXTREMITIES:  No increased edema present.  DP pulses palpable and equal bilaterally.             Assessment & Plan:  CARDIOVASCULAR.  Currently symptoms stable.  Recent right heart catheterization negative.  Due to see Dr  Lady Gary - 7/14.    GI.  Colonoscopy 12/29/05.  Seeing GI regarding follow up.  Had a follow up colonoscopy as outlined.       HEALTH MAINTENANCE.  Physical today.  States she had her mammogram 6/13.  Schedule a follow up mammogram when due.  Colonoscopy - above.

## 2013-01-20 NOTE — Assessment & Plan Note (Signed)
Breathing stable.  Follow.    

## 2013-01-20 NOTE — Assessment & Plan Note (Signed)
Continues on crestor.  Low choelsterol diet and exercise.  Check lipid profile and liver function.

## 2013-01-21 ENCOUNTER — Other Ambulatory Visit: Payer: Self-pay | Admitting: Internal Medicine

## 2013-02-01 ENCOUNTER — Encounter: Payer: Self-pay | Admitting: Emergency Medicine

## 2013-02-15 ENCOUNTER — Other Ambulatory Visit: Payer: Self-pay | Admitting: Internal Medicine

## 2013-02-20 ENCOUNTER — Encounter: Payer: Self-pay | Admitting: Internal Medicine

## 2013-02-27 ENCOUNTER — Other Ambulatory Visit: Payer: Self-pay | Admitting: Internal Medicine

## 2013-02-27 DIAGNOSIS — I10 Essential (primary) hypertension: Secondary | ICD-10-CM | POA: Diagnosis not present

## 2013-02-27 DIAGNOSIS — E782 Mixed hyperlipidemia: Secondary | ICD-10-CM | POA: Diagnosis not present

## 2013-02-27 DIAGNOSIS — J449 Chronic obstructive pulmonary disease, unspecified: Secondary | ICD-10-CM | POA: Diagnosis not present

## 2013-02-27 DIAGNOSIS — J810 Acute pulmonary edema: Secondary | ICD-10-CM | POA: Diagnosis not present

## 2013-03-07 DIAGNOSIS — M1710 Unilateral primary osteoarthritis, unspecified knee: Secondary | ICD-10-CM | POA: Diagnosis not present

## 2013-03-20 ENCOUNTER — Encounter: Payer: Self-pay | Admitting: Internal Medicine

## 2013-04-06 ENCOUNTER — Other Ambulatory Visit: Payer: Self-pay | Admitting: Internal Medicine

## 2013-04-11 ENCOUNTER — Encounter: Payer: Self-pay | Admitting: *Deleted

## 2013-05-24 ENCOUNTER — Ambulatory Visit: Payer: No Typology Code available for payment source | Admitting: Internal Medicine

## 2013-05-24 ENCOUNTER — Ambulatory Visit (INDEPENDENT_AMBULATORY_CARE_PROVIDER_SITE_OTHER): Payer: No Typology Code available for payment source | Admitting: Internal Medicine

## 2013-05-24 ENCOUNTER — Encounter: Payer: Self-pay | Admitting: Internal Medicine

## 2013-05-24 VITALS — BP 118/70 | HR 60 | Temp 98.9°F | Ht 61.25 in | Wt 278.5 lb

## 2013-05-24 DIAGNOSIS — E7800 Pure hypercholesterolemia, unspecified: Secondary | ICD-10-CM | POA: Diagnosis not present

## 2013-05-24 DIAGNOSIS — M129 Arthropathy, unspecified: Secondary | ICD-10-CM

## 2013-05-24 DIAGNOSIS — I10 Essential (primary) hypertension: Secondary | ICD-10-CM | POA: Diagnosis not present

## 2013-05-24 DIAGNOSIS — Z9884 Bariatric surgery status: Secondary | ICD-10-CM

## 2013-05-24 DIAGNOSIS — M1990 Unspecified osteoarthritis, unspecified site: Secondary | ICD-10-CM

## 2013-05-24 DIAGNOSIS — Z23 Encounter for immunization: Secondary | ICD-10-CM

## 2013-05-24 DIAGNOSIS — K219 Gastro-esophageal reflux disease without esophagitis: Secondary | ICD-10-CM

## 2013-05-24 DIAGNOSIS — E119 Type 2 diabetes mellitus without complications: Secondary | ICD-10-CM

## 2013-05-24 DIAGNOSIS — D649 Anemia, unspecified: Secondary | ICD-10-CM

## 2013-05-24 DIAGNOSIS — R0902 Hypoxemia: Secondary | ICD-10-CM

## 2013-05-24 DIAGNOSIS — J45909 Unspecified asthma, uncomplicated: Secondary | ICD-10-CM

## 2013-05-24 DIAGNOSIS — F329 Major depressive disorder, single episode, unspecified: Secondary | ICD-10-CM

## 2013-05-24 DIAGNOSIS — F32A Depression, unspecified: Secondary | ICD-10-CM

## 2013-05-24 LAB — HM DIABETES FOOT EXAM

## 2013-05-26 ENCOUNTER — Encounter: Payer: Self-pay | Admitting: Internal Medicine

## 2013-05-26 DIAGNOSIS — R0902 Hypoxemia: Secondary | ICD-10-CM | POA: Insufficient documentation

## 2013-05-26 NOTE — Assessment & Plan Note (Signed)
On 2 L O2 nasal cannula overnight.  States [employer] is requesting another overnight oximetry to qualify for her oxygen.  Will arrange.

## 2013-05-26 NOTE — Assessment & Plan Note (Signed)
Breathing stable.  Follow.    

## 2013-05-26 NOTE — Assessment & Plan Note (Signed)
Saw Dr Ernest Pine.  Planning for knee surgery 07/02/13.

## 2013-05-26 NOTE — Assessment & Plan Note (Signed)
Still with increased stress.  Mother-n-law living with her.  Has alzheimers.  Mother recently fell and fractured her hip.  Having to take care of her.  On Celexa 10mg  - 1 1/2 tablets q day.  Follow.  Notify me if she needs something more.

## 2013-05-26 NOTE — Assessment & Plan Note (Signed)
Sugars have been under good control.  Off metformin.  Recent a1c 6.0.  Follow.  Up to date with eye checks.  Saw Dr Gentry Fitz 11/13.

## 2013-05-26 NOTE — Assessment & Plan Note (Signed)
Symptoms doing well now.   See above.  Continues on omeprazole.  Follow.   

## 2013-05-26 NOTE — Assessment & Plan Note (Signed)
Colonoscopy 01/01/13 - as outlined.  On integra.  Hgb just checked and improved - 12.0.  Follow.  Continue iron.

## 2013-05-26 NOTE — Assessment & Plan Note (Signed)
Blood pressure under good control.  Same medication regimen.  Follow.  Recent metabolic panel wnl.  Will need close intra op and post op monitoring of her heart rate and blood pressure to avoid extremes.

## 2013-05-26 NOTE — Progress Notes (Signed)
Subjective:    Patient ID: Glenda Zavala, female    DOB: 07/12/1951, 62 y.o.   MRN: [id number]  HPI 62 year old female with past history of hypertension, diabetes, SVT and pulmonary function testing that revealed changes consistent with emphysema.  She comes in today for a scheduled follow up.  Her lap band had slipped.  States she had all her fluid removed and symptoms immediately improved. Since then has had no reoccurring GI problems.  Feels better.  Able to eat now.  No problems swallowing.  No acid reflux.  No nausea or vomiting.  Bowels stable.  She feels her breathing is stable. Saw Dr Meredeth Ide.  Diagnosed with asthma.  No sob.  Has seen Dr Lady Gary.  Right heart cath was negative.  No chest pain or tightness.  Saw Dr Ernest Pine regarding her knee.  Plans to have knee surgery 07/02/13.  Her sugars have been doing well.  AM sugars - 90 - 110 and  PM sugars - 135-140. She is off metformin.  Has been going to water aerobics.  Has started the  Cardio classes.  Doing well with these.  No chest pain or tightness.  Breathing stable during exercise.       Past Medical History  Diagnosis Date  . Hypertension   . Hypercholesterolemia   . Asthma   . Environmental allergies   . PSVT (paroxysmal supraventricular tachycardia)   . GERD (gastroesophageal reflux disease)   . Diabetes mellitus   . COPD (chronic obstructive pulmonary disease)   . Anemia   . Arthritis     Current Outpatient Prescriptions on File Prior to Visit  Medication Sig Dispense Refill  . acetaminophen (TYLENOL) 650 MG CR tablet Take 650 mg by mouth every 8 (eight) hours as needed.      . citalopram (CELEXA) 20 MG tablet TAKE 1+1/2 TABLETS BY MOUTH DAILY AS DIRECTED  45 tablet  2  . cyanocobalamin (,VITAMIN B-12,) 1000 MCG/ML injection Inject 1,000 mcg into the muscle every 30 (thirty) days.      . Fe Fum-FePoly-Vit C-Vit B3 (INTEGRA) 62.5-62.5-40-3 MG CAPS TAKE ONE CAPSULE BY MOUTH DAILY  30 each  5  . fluticasone (FLONASE) 50 MCG/ACT  nasal spray USE TWO SPRAYS IN EACH NOSTRIL EVERY DAY  16 g  5  . hydrochlorothiazide (HYDRODIURIL) 25 MG tablet Take 1 tablet (25 mg total) by mouth daily.  90 tablet  3  . levalbuterol (XOPENEX HFA) 45 MCG/ACT inhaler Inhale 2 puffs into the lungs every 6 (six) hours as needed.      Marland Kitchen lisinopril (PRINIVIL,ZESTRIL) 40 MG tablet TAKE ONE TABLET BY MOUTH EVERY MORNING FOR BLOOD PRESSURE  90 tablet  1  . metFORMIN (GLUCOPHAGE) 1000 MG tablet       . montelukast (SINGULAIR) 10 MG tablet Take 10 mg by mouth at bedtime as needed.       . nystatin (MYCOSTATIN) powder Apply topically 2 (two) times daily.  60 g  1  . nystatin cream (MYCOSTATIN) Apply topically 2 (two) times daily.  30 g  1  . ONETOUCH VERIO test strip TEST BLOOD GLUCOSE TWICE DAILY  50 each  10  . rosuvastatin (CRESTOR) 5 MG tablet Take 1 tablet (5 mg total) by mouth at bedtime.  90 tablet  3  . tiotropium (SPIRIVA HANDIHALER) 18 MCG inhalation capsule Place 1 capsule (18 mcg total) into inhaler and inhale daily.  30 capsule  1  . verapamil (CALAN-SR) 240 MG CR tablet TAKE ONE (1) TABLET  EACH DAY  90 tablet  1   No current facility-administered medications on file prior to visit.    Review of Systems Patient denies any headache, lightheadedness or dizziness.  No sinus pressure.  No chest pain, tightness or palpitations.  No increased shortness of breath.   No acid reflux.   No nauaea or vomiting now.  No abdominal pain.  Bowels stable.  States her blood pressure has been doing well.  Has seen Dr Gentry Fitz (11/13) - for her eyes.  Sugars as outlined.  Doing well off metformin.  Exercising.  Saw Dr Ernest Pine.  Planning for knee surgery 07/02/13.   States needs another overnight oximetry to renew her oxygen.       Objective:   Physical Exam  Filed Vitals:   05/24/13 1104  BP: 118/70  Pulse: 60  Temp: 98.9 F (37.2 C)   Blood pressure recheck:  26/35  62 year old female in no acute distress.   HEENT:  Nares- clear.  Oropharynx -  without lesions. NECK:  Supple.  Nontender.  No audible bruit.  HEART:  Appears to be regular. LUNGS:  No crackles or wheezing audible.  Respirations even and unlabored.  RADIAL PULSE:  Equal bilaterally.  ABDOMEN:  Soft, nontender.  Bowel sounds present and normal.  No audible abdominal bruit.     EXTREMITIES:  No increased edema present.  DP pulses palpable and equal bilaterally.             Assessment & Plan:  CARDIOVASCULAR.  Currently symptoms stable.  Recent right heart catheterization negative.  Just saw Dr Lady Gary in July.  Will need cardiac clearance from Dr Lady Gary.  Appears to be doing well.    GI.  Colonoscopy 12/29/05.   Had a follow up colonoscopy as outlined.    PRE OP.  Sees Dr Lady Gary.  Just evaluated in 7/14.  Will need cardiology clearance and recs prior to surgery.  She will also need close intra op and post op monitoring of her heart rate and blood pressure to avoid extremes.       HEALTH MAINTENANCE.  Physical last visit.   States she had her mammogram 6/13.  Needs her mammogram.  Colonoscopy 5/14 - lipoma and diverticulosis.

## 2013-05-26 NOTE — Assessment & Plan Note (Signed)
Continues on crestor.  Low choelsterol diet and exercise.  Lipid panel just checked 05/03/13 and revealed total cholesterol 184, triglycerides 57, HDL 68 and LDL 105.  Follow.  Liver panel wnl.

## 2013-05-26 NOTE — Assessment & Plan Note (Signed)
Doing better now.  Eating without problems now.  No acid reflux.  Needs follow up with Dr Bufford Lope.  Plans to follow up after her surgery.

## 2013-06-17 ENCOUNTER — Ambulatory Visit: Payer: No Typology Code available for payment source | Admitting: Internal Medicine

## 2013-06-19 ENCOUNTER — Ambulatory Visit (INDEPENDENT_AMBULATORY_CARE_PROVIDER_SITE_OTHER): Payer: No Typology Code available for payment source | Admitting: Internal Medicine

## 2013-06-19 ENCOUNTER — Encounter: Payer: Self-pay | Admitting: Internal Medicine

## 2013-06-19 VITALS — BP 130/70 | HR 60 | Temp 98.2°F | Ht 61.25 in | Wt 282.0 lb

## 2013-06-19 DIAGNOSIS — F439 Reaction to severe stress, unspecified: Secondary | ICD-10-CM

## 2013-06-19 DIAGNOSIS — E119 Type 2 diabetes mellitus without complications: Secondary | ICD-10-CM

## 2013-06-19 DIAGNOSIS — K219 Gastro-esophageal reflux disease without esophagitis: Secondary | ICD-10-CM

## 2013-06-19 DIAGNOSIS — F329 Major depressive disorder, single episode, unspecified: Secondary | ICD-10-CM

## 2013-06-19 DIAGNOSIS — Z733 Stress, not elsewhere classified: Secondary | ICD-10-CM

## 2013-06-19 DIAGNOSIS — Z9884 Bariatric surgery status: Secondary | ICD-10-CM

## 2013-06-19 DIAGNOSIS — R0902 Hypoxemia: Secondary | ICD-10-CM

## 2013-06-19 DIAGNOSIS — D649 Anemia, unspecified: Secondary | ICD-10-CM

## 2013-06-19 DIAGNOSIS — I10 Essential (primary) hypertension: Secondary | ICD-10-CM

## 2013-06-19 DIAGNOSIS — J45909 Unspecified asthma, uncomplicated: Secondary | ICD-10-CM

## 2013-06-19 NOTE — Assessment & Plan Note (Signed)
Still with increased stress.  Mother-n-law living with her.  Has alzheimers.  Mother recently fell and fractured her hip.  Having to take care of her.  Increased stress at home.   On celexa.  Will refer to psych for evaluation and further treatment.

## 2013-06-19 NOTE — Assessment & Plan Note (Addendum)
Breathing stable.  Follow.  Will need close monitoring of her pulmonary status during the peri op period.  Use the inhalers as directed.

## 2013-06-19 NOTE — Assessment & Plan Note (Signed)
Sugars have been under good control.  Off metformin.  Recent a1c 6.0.  Follow.  Up to date with eye checks.  Saw Dr Gentry Fitz 11/13.  Will need close monitoring of her sugars in the peri op period.

## 2013-06-19 NOTE — Progress Notes (Signed)
Subjective:    Patient ID: Glenda Zavala, female    DOB: [birth year], 62 y.o.   MRN: [id number]  HPI 62 year old female with past history of hypertension, diabetes, SVT and pulmonary function testing that revealed changes consistent with emphysema.  She comes in today as a work in to discuss the cancellation of her knee surgery.  She recently saw ortho and was planning to have her knee surgery in 11/14.  She had gained a significant amount of weight and it was decided to postpone the surgery.   She came in today to discuss a weight loss regimen.  She previously had problems with her lap band.   Her lap band had slipped.  States she had all her fluid removed.  Since then has had no reoccurring GI problems, but she has not been limited as far as what she is eating.  She does try to adjust her diet to modify her sugars and her sugars are under good control.  She was staying with her mother recently (taking care of her) and got out of her regular routine.  Is not exercising now.  She feels all of this has contributed to her weight gain.  We discussed a plan for weight loss.  She will return to her exercise routine as soon as her pool opens back up.  Is going to do better with her diet.  She is having no problems swallowing.  No acid reflux.  No nausea or vomiting.  Bowels stable.  She feels her breathing is stable. Saw Dr Meredeth Ide.  Diagnosed with asthma.  No sob.  Has seen Dr Lady Gary.  Right heart cath was negative.  No chest pain or tightness.  Her sugars have been doing well.   She is off metformin.   No chest pain or tightness.  Breathing stable during exercise.  Increased stress.  Has her mother-n-law living with her (has alzheimers). Family relies on her to "take care of everything".  Takes care of her grandchildren (a lot).  On citalopram.  She requested a referral to psych to discuss.      Past Medical History  Diagnosis Date  . Hypertension   . Hypercholesterolemia   . Asthma   . Environmental allergies    . PSVT (paroxysmal supraventricular tachycardia)   . GERD (gastroesophageal reflux disease)   . Diabetes mellitus   . COPD (chronic obstructive pulmonary disease)   . Anemia   . Arthritis     Current Outpatient Prescriptions on File Prior to Visit  Medication Sig Dispense Refill  . acetaminophen (TYLENOL) 650 MG CR tablet Take 650 mg by mouth every 8 (eight) hours as needed.      . Cholecalciferol (VITAMIN D) 2000 UNITS tablet Take 2,000 Units by mouth daily.      . citalopram (CELEXA) 20 MG tablet TAKE 1+1/2 TABLETS BY MOUTH DAILY AS DIRECTED  45 tablet  2  . cyanocobalamin (,VITAMIN B-12,) 1000 MCG/ML injection Inject 1,000 mcg into the muscle every 30 (thirty) days.      . Fe Fum-FePoly-Vit C-Vit B3 (INTEGRA) 62.5-62.5-40-3 MG CAPS TAKE ONE CAPSULE BY MOUTH DAILY  30 each  5  . fluticasone (FLONASE) 50 MCG/ACT nasal spray USE TWO SPRAYS IN EACH NOSTRIL EVERY DAY  16 g  5  . hydrochlorothiazide (HYDRODIURIL) 25 MG tablet Take 1 tablet (25 mg total) by mouth daily.  90 tablet  3  . levalbuterol (XOPENEX HFA) 45 MCG/ACT inhaler Inhale 2 puffs into the lungs every 6 (six)  hours as needed.      Marland Kitchen lisinopril (PRINIVIL,ZESTRIL) 40 MG tablet TAKE ONE TABLET BY MOUTH EVERY MORNING FOR BLOOD PRESSURE  90 tablet  1  . metFORMIN (GLUCOPHAGE) 1000 MG tablet       . montelukast (SINGULAIR) 10 MG tablet Take 10 mg by mouth at bedtime as needed.       . nystatin (MYCOSTATIN) powder Apply topically 2 (two) times daily.  60 g  1  . nystatin cream (MYCOSTATIN) Apply topically 2 (two) times daily.  30 g  1  . ONETOUCH VERIO test strip TEST BLOOD GLUCOSE TWICE DAILY  50 each  10  . rosuvastatin (CRESTOR) 5 MG tablet Take 1 tablet (5 mg total) by mouth at bedtime.  90 tablet  3  . tiotropium (SPIRIVA HANDIHALER) 18 MCG inhalation capsule Place 1 capsule (18 mcg total) into inhaler and inhale daily.  30 capsule  1  . verapamil (CALAN-SR) 240 MG CR tablet TAKE ONE (1) TABLET EACH DAY  90 tablet  1   No  current facility-administered medications on file prior to visit.    Review of Systems Patient denies any headache, lightheadedness or dizziness.  No sinus pressure.  No chest pain, tightness or palpitations. No increased shortness of breath.  Breathing stable.   No acid reflux.   No nauaea or vomiting.   No abdominal pain.  Bowels stable.  States her blood pressure has been doing well.  Sugars doing well off metformin.  Exercising.  Discussed weight loss.  Increased stress as outlined.       Objective:   Physical Exam  Filed Vitals:   06/19/13 1221  BP: 130/70  Pulse: 60  Temp: 98.2 F (43.15 C)   62 year old female in no acute distress.   HEENT:  Nares- clear.  Oropharynx - without lesions. NECK:  Supple.  Nontender.  No audible bruit.  HEART:  Appears to be regular. LUNGS:  No crackles or wheezing audible.  Respirations even and unlabored.  RADIAL PULSE:  Equal bilaterally.  ABDOMEN:  Soft, nontender.  Bowel sounds present and normal.  No audible abdominal bruit.     EXTREMITIES:  No increased edema present.  DP pulses palpable and equal bilaterally.   FEET:  Without lesions.            Assessment & Plan:  NEED FOR WEIGHT LOSS.  Discussed at length with her.  Plan in place.  She plans to adjust her diet and start back to her exercise.  Follow.  Will keep me posted.    CARDIOVASCULAR.  Currently symptoms stable.  Recent right heart catheterization negative.  Just saw Dr Lady Gary in July.  Will need cardiac clearance from Dr Lady Gary.  Appears to be doing well.    GI.  Colonoscopy 12/29/05.   Had a follow up colonoscopy as outlined.    PRE OP.  Sees Dr Lady Gary.  Just evaluated in 7/14.  Will need cardiology clearance and recs prior to surgery.  She will also need close intra op and post op monitoring of her heart rate and blood pressure to avoid extremes.  Surgery cancelled for now.  We discussed the importance of weight loss and diet adjustment.  Plan in place.  Will keep me posted.         HEALTH MAINTENANCE.  Physical 01/17/13.    States she had her mammogram 6/13.  Needs her mammogram.  Colonoscopy 5/14 - lipoma and diverticulosis.

## 2013-06-19 NOTE — Assessment & Plan Note (Signed)
Colonoscopy 01/01/13 - as outlined.  On integra.  Hgb just checked and improved - 12.0.  Follow.  Continue iron.  

## 2013-06-19 NOTE — Assessment & Plan Note (Signed)
On 2 L O2 nasal cannula overnight.    

## 2013-06-19 NOTE — Assessment & Plan Note (Signed)
Doing better now.  Eating without problems now.  No acid reflux.  Needs follow up with Dr Bufford Lope.  She plans to go ahead and f/u with him.  May have her port adjusted.

## 2013-06-19 NOTE — Assessment & Plan Note (Signed)
Symptoms doing well now.   See above.  Continues on omeprazole.  Follow.   

## 2013-06-19 NOTE — Assessment & Plan Note (Signed)
Blood pressure under good control.  Same medication regimen.  Follow.  Recent metabolic panel wnl.  Will need close intra op and post op monitoring of her heart rate and blood pressure to avoid extremes.   

## 2013-07-08 ENCOUNTER — Other Ambulatory Visit: Payer: Self-pay | Admitting: Internal Medicine

## 2013-07-10 ENCOUNTER — Other Ambulatory Visit: Payer: Self-pay | Admitting: Internal Medicine

## 2013-07-16 ENCOUNTER — Other Ambulatory Visit: Payer: Self-pay | Admitting: Internal Medicine

## 2013-08-05 ENCOUNTER — Other Ambulatory Visit: Payer: Self-pay | Admitting: Internal Medicine

## 2013-08-12 ENCOUNTER — Other Ambulatory Visit: Payer: Self-pay | Admitting: Internal Medicine

## 2013-08-13 ENCOUNTER — Encounter: Payer: Self-pay | Admitting: Internal Medicine

## 2013-08-28 ENCOUNTER — Ambulatory Visit: Payer: No Typology Code available for payment source | Admitting: Psychology

## 2013-09-10 ENCOUNTER — Ambulatory Visit (INDEPENDENT_AMBULATORY_CARE_PROVIDER_SITE_OTHER): Payer: No Typology Code available for payment source | Admitting: Psychology

## 2013-09-10 DIAGNOSIS — F4323 Adjustment disorder with mixed anxiety and depressed mood: Secondary | ICD-10-CM | POA: Diagnosis not present

## 2013-09-12 ENCOUNTER — Other Ambulatory Visit: Payer: Self-pay | Admitting: Internal Medicine

## 2013-09-18 ENCOUNTER — Ambulatory Visit: Payer: No Typology Code available for payment source | Admitting: Psychology

## 2013-09-25 ENCOUNTER — Encounter: Payer: Self-pay | Admitting: *Deleted

## 2013-09-26 ENCOUNTER — Ambulatory Visit: Payer: No Typology Code available for payment source | Admitting: Internal Medicine

## 2013-12-03 ENCOUNTER — Other Ambulatory Visit: Payer: Self-pay | Admitting: Internal Medicine

## 2013-12-03 NOTE — Telephone Encounter (Signed)
Electronic Rx request for Celexa, Pt has not been seen in the past 6 months and does not have any future appts schedule. Please advise.

## 2013-12-04 NOTE — Telephone Encounter (Signed)
I am ok to refill x 2, but she needs a physical scheduled after 01/17/14.  Please notify pt and then have Robin schedule a physical.  Thanks.

## 2013-12-04 NOTE — Telephone Encounter (Signed)
See below-pt notified that we will call her with a CPE appt

## 2013-12-12 NOTE — Telephone Encounter (Signed)
appintment 03/20/14  Pt aware

## 2014-01-02 ENCOUNTER — Other Ambulatory Visit: Payer: Self-pay | Admitting: Internal Medicine

## 2014-01-29 ENCOUNTER — Other Ambulatory Visit: Payer: Self-pay | Admitting: Internal Medicine

## 2014-01-29 NOTE — Telephone Encounter (Signed)
Appt 03/20/14

## 2014-01-30 ENCOUNTER — Telehealth: Payer: Self-pay | Admitting: Internal Medicine

## 2014-01-30 NOTE — Telephone Encounter (Signed)
Received notification that she was evaluated for lower extremity swelling and redness.  She was placed on lasix and levaquin.  Need to confirm legs are doing better.  If any persistent problems, will need to be reevaluated.  Also, her magnesium was low.  Need to make sure she is taking magnesium.  Needs to be rechecked and needs a1c and lipid panel checked.  If she wants to schedule to have checked her, let me know and I will place the order.

## 2014-01-30 NOTE — Telephone Encounter (Signed)
Pt states her legs are much improved.She followed up with that clinic on Monday. She was advised to stop the Lasix and resume her HCTZ. Has one pill left of the Levaquin. No persistent problems with her legs.She is taking magnesium 400 mg tid. She will return to [facility] to have her magnesium rechecked and her lipids and a1c checked as well.

## 2014-01-30 NOTE — Telephone Encounter (Signed)
Noted  

## 2014-03-20 ENCOUNTER — Encounter: Payer: Self-pay | Admitting: Internal Medicine

## 2014-03-20 ENCOUNTER — Ambulatory Visit (INDEPENDENT_AMBULATORY_CARE_PROVIDER_SITE_OTHER): Payer: No Typology Code available for payment source | Admitting: Internal Medicine

## 2014-03-20 VITALS — BP 110/78 | HR 90 | Temp 98.5°F | Ht 61.0 in | Wt 323.8 lb

## 2014-03-20 DIAGNOSIS — F329 Major depressive disorder, single episode, unspecified: Secondary | ICD-10-CM

## 2014-03-20 DIAGNOSIS — Z1239 Encounter for other screening for malignant neoplasm of breast: Secondary | ICD-10-CM

## 2014-03-20 DIAGNOSIS — D649 Anemia, unspecified: Secondary | ICD-10-CM

## 2014-03-20 DIAGNOSIS — E669 Obesity, unspecified: Secondary | ICD-10-CM

## 2014-03-20 DIAGNOSIS — Z0000 Encounter for general adult medical examination without abnormal findings: Secondary | ICD-10-CM

## 2014-03-20 DIAGNOSIS — E119 Type 2 diabetes mellitus without complications: Secondary | ICD-10-CM

## 2014-03-20 DIAGNOSIS — Z9884 Bariatric surgery status: Secondary | ICD-10-CM

## 2014-03-20 DIAGNOSIS — M25569 Pain in unspecified knee: Secondary | ICD-10-CM | POA: Diagnosis not present

## 2014-03-20 DIAGNOSIS — I499 Cardiac arrhythmia, unspecified: Secondary | ICD-10-CM

## 2014-03-20 DIAGNOSIS — I10 Essential (primary) hypertension: Secondary | ICD-10-CM | POA: Diagnosis not present

## 2014-03-20 DIAGNOSIS — F32A Depression, unspecified: Secondary | ICD-10-CM

## 2014-03-20 DIAGNOSIS — E7800 Pure hypercholesterolemia, unspecified: Secondary | ICD-10-CM

## 2014-03-20 DIAGNOSIS — F3289 Other specified depressive episodes: Secondary | ICD-10-CM

## 2014-03-20 DIAGNOSIS — R0902 Hypoxemia: Secondary | ICD-10-CM | POA: Diagnosis not present

## 2014-03-20 DIAGNOSIS — K219 Gastro-esophageal reflux disease without esophagitis: Secondary | ICD-10-CM

## 2014-03-20 DIAGNOSIS — J45909 Unspecified asthma, uncomplicated: Secondary | ICD-10-CM

## 2014-03-20 NOTE — Progress Notes (Signed)
Pre visit review using our clinic review tool, if applicable. No additional management support is needed unless otherwise documented below in the visit note. 

## 2014-03-23 ENCOUNTER — Encounter: Payer: Self-pay | Admitting: Internal Medicine

## 2014-03-23 DIAGNOSIS — M25569 Pain in unspecified knee: Secondary | ICD-10-CM | POA: Insufficient documentation

## 2014-03-23 DIAGNOSIS — E669 Obesity, unspecified: Secondary | ICD-10-CM | POA: Insufficient documentation

## 2014-03-23 NOTE — Assessment & Plan Note (Signed)
On 2 L O2 nasal cannula overnight.

## 2014-03-23 NOTE — Assessment & Plan Note (Signed)
Doing better now.  Eating without problems now.  No acid reflux.  She request referral to local bariatric surgeon.

## 2014-03-23 NOTE — Assessment & Plan Note (Signed)
Continues on crestor.  Low choelsterol diet and exercise.  Follow lipid panel and liver function.

## 2014-03-23 NOTE — Assessment & Plan Note (Signed)
Sugars have been under good control.  Off metformin.   Follow.  Up to date with eye checks.

## 2014-03-23 NOTE — Assessment & Plan Note (Signed)
Symptoms doing well now.   See above.  Continues on omeprazole.  Follow.

## 2014-03-23 NOTE — Assessment & Plan Note (Signed)
Increased knee pain.  Limiting her activity.  She has gained weight.  She request referral to Dr Wynelle Link for further evaluation and to discuss treatment options.

## 2014-03-23 NOTE — Assessment & Plan Note (Signed)
Blood pressure under good control.  Same medication regimen.  Follow.  Recent metabolic panel wnl.

## 2014-03-23 NOTE — Assessment & Plan Note (Signed)
Colonoscopy 01/01/13 - as outlined.  On integra.  Follow.  Continue iron.

## 2014-03-23 NOTE — Assessment & Plan Note (Signed)
Breathing stable.  Follow.    

## 2014-03-23 NOTE — Progress Notes (Signed)
Subjective:    Patient ID: Glenda Zavala, female    DOB: 1951/01/02, 63 y.o.   MRN: [id number]  HPI 63 year old female with past history of hypertension, diabetes, SVT and pulmonary function testing that revealed changes consistent with emphysema.  She comes in today to follow up on these issues as well as for a complete physical exam.  She reports that her knee is bothering her more.  Really limiting her walking and activity.  Has gained weight.  She had seen ortho previously and was planning to have her knee surgery in 11/14.  She had gained a significant amount of weight and it was decided to postpone the surgery.   She previously had problems with her lap band.   Her lap band had slipped.  States she had all her fluid removed.  Since then has had no reoccurring GI problems, but she has not been limited as far as what she is eating.  She does try to adjust her diet to modify her sugars and her sugars have been under good control.  She is having no problems swallowing.  No acid reflux.  No nausea or vomiting.  Bowels stable.  She feels her breathing is stable. Saw Dr Raul Del.  Diagnosed with asthma.  No sob.  Has seen Dr Ubaldo Glassing.  Right heart cath was negative.  No chest pain or tightness.  Her sugars have been doing well.   She is off metformin.   No chest pain or tightness.  Breathing stable.  Increased stress.  Has her mother-n-law living with her (has alzheimers). Family relies on her to "take care of everything".  Takes care of her grandchildren (a lot).  On citalopram.  She request referral to have another opinion about her knee surgery, since increased pain and limiting her activity.       Past Medical History  Diagnosis Date  . Hypertension   . Hypercholesterolemia   . Asthma   . Environmental allergies   . PSVT (paroxysmal supraventricular tachycardia)   . GERD (gastroesophageal reflux disease)   . Diabetes mellitus   . COPD (chronic obstructive pulmonary disease)   . Anemia   .  Arthritis     Current Outpatient Prescriptions on File Prior to Visit  Medication Sig Dispense Refill  . acetaminophen (TYLENOL) 650 MG CR tablet Take 650 mg by mouth every 8 (eight) hours as needed.      . Blood Glucose Monitoring Suppl (ONETOUCH VERIO IQ SYSTEM) W/DEVICE KIT AS DIRECTED  1 kit  0  . Cholecalciferol (VITAMIN D) 2000 UNITS tablet Take 2,000 Units by mouth daily.      . citalopram (CELEXA) 20 MG tablet TAKE 1&1/2 TABLETS BY MOUTH ONCE DAILY  45 tablet  2  . CRESTOR 5 MG tablet TAKE ONE TABLET BY MOUTH EVERY NIGHT AT BEDTIME  90 tablet  1  . cyanocobalamin (,VITAMIN B-12,) 1000 MCG/ML injection Inject 1,000 mcg into the muscle every 30 (thirty) days.      . Fe Fum-FePoly-Vit C-Vit B3 (INTEGRA) 62.5-62.5-40-3 MG CAPS TAKE ONE CAPSULE BY MOUTH DAILY  30 each  5  . fluticasone (FLONASE) 50 MCG/ACT nasal spray USE TWO SPRAYS IN EACH NOSTRIL EVERY DAY  16 g  5  . hydrochlorothiazide (HYDRODIURIL) 25 MG tablet TAKE ONE (1) TABLET BY MOUTH EVERY DAY  90 tablet  1  . lisinopril (PRINIVIL,ZESTRIL) 40 MG tablet TAKE ONE TABLET BY MOUTH EVERY MORNING FOR BLOOD PRESSURE  90 tablet  0  . metFORMIN (  GLUCOPHAGE) 1000 MG tablet TAKE ONE (1) TABLET BY MOUTH TWO (2) TIMES DAILY  60 tablet  1  . montelukast (SINGULAIR) 10 MG tablet Take 10 mg by mouth at bedtime as needed.       . nystatin (MYCOSTATIN) powder Apply topically 2 (two) times daily.  60 g  1  . nystatin cream (MYCOSTATIN) Apply topically 2 (two) times daily.  30 g  1  . ONETOUCH VERIO test strip TEST BLOOD GLUCOSE TWICE DAILY  50 each  10  . SPIRIVA HANDIHALER 18 MCG inhalation capsule INHALE CONTENTS OF ONE CAPSULE ONCE A DAY AS DIRECTED  30 capsule  5  . verapamil (CALAN-SR) 240 MG CR tablet TAKE ONE (1) TABLET EACH DAY  90 tablet  1  . XOPENEX HFA 45 MCG/ACT inhaler INHALE 2 PUFFS AS DIRECTED BEFORE EXERTION AND EVERY 4 HOURS AS NEEDED  15 g  2   No current facility-administered medications on file prior to visit.    Review of  Systems Patient denies any headache, lightheadedness or dizziness.  No sinus pressure.  No chest pain, tightness or palpitations. No increased shortness of breath.  Breathing stable.   No acid reflux.   No nauaea or vomiting.   No abdominal pain.  Bowels stable.  States her blood pressure has been doing well.  Sugars doing well off metformin.   Discussed weight loss. She is s/p lap band procedure.  See above.  She request referral to local surgeon.   Increased stress as outlined.  Increased knee pain as outlined.       Objective:   Physical Exam  Filed Vitals:   03/20/14 1436  BP: 110/78  Pulse: 90  Temp: 98.5 F (36.9 C)   Blood pressure recheck:  30/68  63 year old female in no acute distress.   HEENT:  Nares- clear.  Oropharynx - without lesions. NECK:  Supple.  Nontender.  No audible bruit.  HEART:  Appears to be regular. LUNGS:  No crackles or wheezing audible.  Respirations even and unlabored.  RADIAL PULSE:  Equal bilaterally.    BREASTS:  No nipple discharge or nipple retraction present.  Could not appreciate any distinct nodules or axillary adenopathy.  ABDOMEN:  Soft, nontender.  Bowel sounds present and normal.  No audible abdominal bruit.  GU:  Not performed.    EXTREMITIES:  No increased edema present.  Stable.  Some stasis changes.  No acute erythema.     FEET:  No lesions.        Assessment & Plan:  NEED FOR WEIGHT LOSS.  Discussed at length with her.  She plans to adjust her diet and start back to her exercise as tolerated.  Limited by her knee pain.  Follow.    CARDIOVASCULAR.  Currently symptoms stable.  Recent right heart catheterization negative.  Sees Dr Ubaldo Glassing.  Will need cardiac clearance from Dr Ubaldo Glassing before knee surgery.    GI.  Colonoscopy 12/29/05.   Had a follow up colonoscopy as outlined 5/14.     HEALTH MAINTENANCE.  Physical today.    Needs her mammogram.  Overdue.  Colonoscopy 5/14 - lipoma and diverticulosis.    I spent 25 minutes with the patient  and more than 50% of the time was spent in consultation regarding the above.

## 2014-03-23 NOTE — Assessment & Plan Note (Signed)
Discussed weight loss, diet and exercise.  Request referral to local bariatric surgeon.   Is s/p lap band.

## 2014-03-23 NOTE — Assessment & Plan Note (Signed)
Still with increased stress.  Mother-n-law living with her.  Has alzheimers.  Mother recently fell and fractured her hip.  Having to take care of her.  Increased stress at home.   On celexa.  Stable.

## 2014-04-24 ENCOUNTER — Other Ambulatory Visit: Payer: Self-pay | Admitting: Internal Medicine

## 2014-05-12 ENCOUNTER — Other Ambulatory Visit: Payer: Self-pay | Admitting: Internal Medicine

## 2014-05-28 ENCOUNTER — Telehealth: Payer: Self-pay | Admitting: Internal Medicine

## 2014-05-28 NOTE — Telephone Encounter (Signed)
Notify pt that I have placed the order for the referral to surgery to discuss weight loss options.  They should be contacting her with an appt.   Thanks.

## 2014-05-28 NOTE — Telephone Encounter (Signed)
Left message on pts VM advising referral has been placed.

## 2014-06-10 ENCOUNTER — Other Ambulatory Visit: Payer: Self-pay | Admitting: Internal Medicine

## 2014-06-20 ENCOUNTER — Ambulatory Visit: Payer: Self-pay | Admitting: Internal Medicine

## 2014-07-04 ENCOUNTER — Other Ambulatory Visit: Payer: Self-pay | Admitting: Internal Medicine

## 2014-07-10 DIAGNOSIS — Z23 Encounter for immunization: Secondary | ICD-10-CM | POA: Diagnosis not present

## 2014-07-18 ENCOUNTER — Other Ambulatory Visit: Payer: Self-pay | Admitting: Internal Medicine

## 2014-07-22 ENCOUNTER — Other Ambulatory Visit: Payer: Self-pay | Admitting: *Deleted

## 2014-07-22 MED ORDER — GLUCOSE BLOOD VI STRP: ORAL_STRIP | Status: DC

## 2014-07-22 MED ORDER — ACCU-CHEK SOFT TOUCH LANCETS MISC: Status: AC

## 2014-08-01 ENCOUNTER — Ambulatory Visit (INDEPENDENT_AMBULATORY_CARE_PROVIDER_SITE_OTHER): Payer: No Typology Code available for payment source | Admitting: Internal Medicine

## 2014-08-01 ENCOUNTER — Encounter: Payer: Self-pay | Admitting: Internal Medicine

## 2014-08-01 VITALS — BP 132/80 | HR 58 | Temp 97.4°F | Ht 61.0 in | Wt 315.8 lb

## 2014-08-01 DIAGNOSIS — Z9884 Bariatric surgery status: Secondary | ICD-10-CM

## 2014-08-01 DIAGNOSIS — Z6841 Body Mass Index (BMI) 40.0 and over, adult: Secondary | ICD-10-CM

## 2014-08-01 DIAGNOSIS — E7800 Pure hypercholesterolemia, unspecified: Secondary | ICD-10-CM

## 2014-08-01 DIAGNOSIS — F32A Depression, unspecified: Secondary | ICD-10-CM

## 2014-08-01 DIAGNOSIS — K219 Gastro-esophageal reflux disease without esophagitis: Secondary | ICD-10-CM | POA: Diagnosis not present

## 2014-08-01 DIAGNOSIS — L989 Disorder of the skin and subcutaneous tissue, unspecified: Secondary | ICD-10-CM

## 2014-08-01 DIAGNOSIS — E119 Type 2 diabetes mellitus without complications: Secondary | ICD-10-CM

## 2014-08-01 DIAGNOSIS — I10 Essential (primary) hypertension: Secondary | ICD-10-CM

## 2014-08-01 DIAGNOSIS — D649 Anemia, unspecified: Secondary | ICD-10-CM

## 2014-08-01 DIAGNOSIS — F329 Major depressive disorder, single episode, unspecified: Secondary | ICD-10-CM | POA: Diagnosis not present

## 2014-08-01 DIAGNOSIS — Z1239 Encounter for other screening for malignant neoplasm of breast: Secondary | ICD-10-CM

## 2014-08-01 MED ORDER — CITALOPRAM HYDROBROMIDE 40 MG PO TABS: 40.0000 mg | ORAL_TABLET | Freq: Every day | ORAL | Status: DC

## 2014-08-01 NOTE — Progress Notes (Signed)
Pre visit review using our clinic review tool, if applicable. No additional management support is needed unless otherwise documented below in the visit note. 

## 2014-08-01 NOTE — Progress Notes (Signed)
Subjective:    Patient ID: Glenda Zavala, female    DOB: 06/02/1951, 63 y.o.   MRN: [id number]  HPI 63 year old female with past history of hypertension, diabetes, SVT and pulmonary function testing that revealed changes consistent with emphysema.  She had seen ortho previously and was planning to have her knee surgery in 11/14.  She had gained a significant amount of weight and it was decided to postpone the surgery.   She previously had problems with her lap band.   Her lap band had slipped.  States she had all her fluid removed.  Since then has had no reoccurring GI problems, but she has not been limited as far as what she is eating.  She does try to adjust her diet to modify her sugars and her sugars have been under good control.  She is having no problems swallowing.  No acid reflux.  No nausea or vomiting.  Bowels stable.  She has been paying more attn to her diet lately and has lost some weight.  She feels her breathing is stable. Saw Dr Fleming.  Diagnosed with asthma.  No sob.  Has seen Dr Fath.  Right heart cath was negative.  No chest pain or tightness.  Her sugars have been doing well.   She is off metformin.   No chest pain or tightness.  Breathing stable.  Increased stress.  Has her mother-n-law living with her (has alzheimers).  Her mother had a stroke.  She is living with her (temporarily) and having to help take care of her.  Increased stress.  On citalopram.  She feels she needs to increase the dose. States her blood pressure has been doing well.  Blood pressure averaging 120-124/68-72.  Persistent skin lesions.     Past Medical History  Diagnosis Date  . Hypertension   . Hypercholesterolemia   . Asthma   . Environmental allergies   . PSVT (paroxysmal supraventricular tachycardia)   . GERD (gastroesophageal reflux disease)   . Diabetes mellitus   . COPD (chronic obstructive pulmonary disease)   . Anemia   . Arthritis     Current Outpatient Prescriptions on File Prior  to Visit  Medication Sig Dispense Refill  . acetaminophen (TYLENOL) 650 MG CR tablet Take 650 mg by mouth every 8 (eight) hours as needed.    . Blood Glucose Monitoring Suppl (ONETOUCH VERIO IQ SYSTEM) W/DEVICE KIT AS DIRECTED 1 kit 0  . Cholecalciferol (VITAMIN D) 2000 UNITS tablet Take 2,000 Units by mouth daily.    . citalopram (CELEXA) 20 MG tablet TAKE 1+1/2 TABLETS BY MOUTH DAILY AS DIRECTED 45 tablet 3  . CRESTOR 5 MG tablet TAKE ONE TABLET BY MOUTH EVERY NIGHT AT BEDTIME 90 tablet 1  . cyanocobalamin (,VITAMIN B-12,) 1000 MCG/ML injection Inject 1,000 mcg into the muscle every 30 (thirty) days.    . Fe Fum-FePoly-Vit C-Vit B3 (INTEGRA) 62.5-62.5-40-3 MG CAPS TAKE ONE CAPSULE BY MOUTH DAILY 30 each 5  . fluticasone (FLONASE) 50 MCG/ACT nasal spray USE TWO SPRAYS IN EACH NOSTRIL EVERY DAY 16 g 5  . glucose blood (ONETOUCH VERIO) test strip TEST BLOOD GLUCOSE TWICE DAILY 50 each 5  . hydrochlorothiazide (HYDRODIURIL) 25 MG tablet TAKE ONE (1) TABLET EACH DAY 90 tablet 2  . Lancets (ACCU-CHEK SOFT TOUCH) lancets Use as instructed 100 each 1  . lisinopril (PRINIVIL,ZESTRIL) 40 MG tablet TAKE ONE TABLET EACH MORNING FOR BLOOD PRESSURE 90 tablet 0  . metFORMIN (GLUCOPHAGE) 1000 MG tablet TAKE ONE   TABLET TWICE DAILY 60 tablet 5  . montelukast (SINGULAIR) 10 MG tablet Take 10 mg by mouth at bedtime as needed.     . nystatin (MYCOSTATIN) powder APPLY TO AFFECTED AREAS TOPICALLY TWICE DAILY 60 g 2  . nystatin cream (MYCOSTATIN) Apply topically 2 (two) times daily. 30 g 1  . SPIRIVA HANDIHALER 18 MCG inhalation capsule INHALE CONTENTS OF ONE CAPSULE ONCE A DAY AS DIRECTED 30 capsule 5  . verapamil (CALAN-SR) 240 MG CR tablet TAKE ONE (1) TABLET BY MOUTH EVERY DAY 90 tablet 1  . XOPENEX HFA 45 MCG/ACT inhaler INHALE 2 PUFFS AS DIRECTED BEFORE EXERTION AND EVERY 4 HOURS AS NEEDED 15 g 2   No current facility-administered medications on file prior to visit.    Review of Systems Patient denies any  headache, lightheadedness or dizziness.  No sinus pressure.  No chest pain, tightness or palpitations. No increased shortness of breath.  Breathing stable.   No acid reflux.   No nauaea or vomiting.   No abdominal pain.  Bowels stable.  States her blood pressure has been doing well.  Sugars doing well off metformin.   Discussed weight loss.  She is s/p lap band procedure.  See above.  She has adjusted her diet.  Has lost weight.  She request referral to local surgeon.  appt with Dr Darnell Level.   Increased stress as outlined.       Objective:   Physical Exam  Filed Vitals:   08/01/14 0924  BP: 132/80  Pulse: 58  Temp: 97.4 F (36.3 C)   Blood pressure recheck:  78/52  63 year old female in no acute distress.   HEENT:  Nares- clear.  Oropharynx - without lesions. NECK:  Supple.  Nontender.  No audible bruit.  HEART:  Appears to be regular. LUNGS:  No crackles or wheezing audible.  Respirations even and unlabored.  RADIAL PULSE:  Equal bilaterally.   ABDOMEN:  Soft, nontender.  Bowel sounds present and normal.  No audible abdominal bruit. EXTREMITIES:  No increased edema present.  Stable.  Some stasis changes.  No acute erythema.     FEET:  No lesions.        Assessment & Plan:  1. Breast cancer screening - MM DIGITAL SCREENING BILATERAL; Future  2. Morbid obesity with BMI of 50.0-59.9, adult Diet and exercise.  She has adjusted her diet.  Has lost some weight.  appt with Dr Darnell Level.    3. Essential hypertension Blood pressure has been doing well.  Same medication regimen.  Follow.  Check met b.   4. Gastroesophageal reflux disease, esophagitis presence not specified Controlled.    5. Type 2 diabetes mellitus without complication Sugars have been doing well.  Keep up to date with eye checks. Check met b, a1c.    6. Anemia, unspecified anemia type Low hgb.  Recheck cbc and ferritin.  She missed her last labs.    7. Depression Increased stress as outlined.  Feels need to increase  citalopram.  Will increase to 66m q day.  Follow.    8. Hypercholesterolemia Low cholesterol diet.  On crestor.  Follow lipid panel and liver function tests.    9. Hx of laparoscopic gastric banding See above.  Request to establish with someone local.  appt with Dr BDarnell Level    10. Skin lesion Persistent skin lesion on her lower back and lower mid abdomen.  Refer to dermatology for further evaluation.    11. NEED FOR WEIGHT LOSS.  Have discussed at length with her.  She has adjusted her diet.  Follow.  appt with Dr Bruce.   12. CARDIOVASCULAR.  Currently symptoms stable.  Recent right heart catheterization negative.  Sees Dr Fath.   13. GI.  Colonoscopy 12/29/05.   Had a follow up colonoscopy as outlined 5/14.     HEALTH MAINTENANCE.  Physical 03/20/14.    Needs her mammogram.  Overdue.  Colonoscopy 5/14 - lipoma and diverticulosis.    I spent over 40 minutes with the patient and more than 50% of the time was spent in consultation regarding the above.   

## 2014-08-02 LAB — CBC AND DIFFERENTIAL
HCT: 40 % (ref 36–46)
Hemoglobin: 12.3 g/dL (ref 12.0–16.0)
NEUTROS ABS: 5 /uL
PLATELETS: 276 10*3/uL (ref 150–399)
WBC: 7.6 10^3/mL

## 2014-08-02 LAB — TSH: TSH: 2.39 u[IU]/mL (ref 0.41–5.90)

## 2014-08-02 LAB — HEPATIC FUNCTION PANEL
ALT: 10 U/L (ref 7–35)
AST: 7 U/L — AB (ref 13–35)
Alkaline Phosphatase: 77 U/L (ref 25–125)
BILIRUBIN, TOTAL: 0.4 mg/dL

## 2014-08-02 LAB — BASIC METABOLIC PANEL
BUN: 20 mg/dL (ref 4–21)
Creatinine: 0.8 mg/dL (ref 0.5–1.1)
GLUCOSE: 116 mg/dL
Potassium: 4.1 mmol/L (ref 3.4–5.3)
Sodium: 142 mmol/L (ref 137–147)

## 2014-08-02 LAB — LIPID PANEL
Cholesterol: 161 mg/dL (ref 0–200)
HDL: 66 mg/dL (ref 35–70)
LDL CALC: 83 mg/dL
Triglycerides: 58 mg/dL (ref 40–160)

## 2014-08-03 ENCOUNTER — Encounter: Payer: Self-pay | Admitting: Internal Medicine

## 2014-08-03 DIAGNOSIS — L989 Disorder of the skin and subcutaneous tissue, unspecified: Secondary | ICD-10-CM | POA: Insufficient documentation

## 2014-08-03 DIAGNOSIS — Z6841 Body Mass Index (BMI) 40.0 and over, adult: Secondary | ICD-10-CM

## 2014-08-05 ENCOUNTER — Encounter: Payer: Self-pay | Admitting: Internal Medicine

## 2014-08-06 ENCOUNTER — Encounter: Payer: Self-pay | Admitting: Internal Medicine

## 2014-08-24 ENCOUNTER — Telehealth: Payer: Self-pay | Admitting: Internal Medicine

## 2014-08-24 NOTE — Telephone Encounter (Signed)
Notify pt that her kidney function tests and liver function tests are within normal limits.  Her cholesterol looks good.  Thyroid tests and hgb - wnl.  Overall sugar control ok.  Vitamin D slightly decreased.  If not on vitamin D supplements, then start vitamin D3 1000 q day.  Will follow.

## 2014-08-25 NOTE — Telephone Encounter (Signed)
Called and advised patient of results,  verbalized understanding. 

## 2014-09-02 ENCOUNTER — Other Ambulatory Visit: Payer: Self-pay | Admitting: Internal Medicine

## 2014-12-02 ENCOUNTER — Telehealth: Payer: Self-pay | Admitting: *Deleted

## 2014-12-02 NOTE — Telephone Encounter (Signed)
Order written and place in your box.

## 2014-12-02 NOTE — Telephone Encounter (Signed)
Rx placed up front & pt notified

## 2014-12-02 NOTE — Telephone Encounter (Signed)
Pt called, has appt 12/09/14. Requesting lab order to have labs drawn at [facility]. Pt will pick up when ready. What labs do you want and I can write up order?

## 2014-12-05 ENCOUNTER — Ambulatory Visit: Payer: Self-pay | Admitting: Internal Medicine

## 2014-12-09 ENCOUNTER — Encounter: Payer: Self-pay | Admitting: Internal Medicine

## 2014-12-09 ENCOUNTER — Ambulatory Visit (INDEPENDENT_AMBULATORY_CARE_PROVIDER_SITE_OTHER): Payer: No Typology Code available for payment source | Admitting: Internal Medicine

## 2014-12-09 ENCOUNTER — Other Ambulatory Visit: Payer: Self-pay | Admitting: Internal Medicine

## 2014-12-09 VITALS — BP 113/74 | HR 58 | Temp 98.2°F | Ht 61.0 in | Wt 310.2 lb

## 2014-12-09 DIAGNOSIS — I10 Essential (primary) hypertension: Secondary | ICD-10-CM | POA: Diagnosis not present

## 2014-12-09 DIAGNOSIS — K219 Gastro-esophageal reflux disease without esophagitis: Secondary | ICD-10-CM

## 2014-12-09 DIAGNOSIS — M25569 Pain in unspecified knee: Secondary | ICD-10-CM

## 2014-12-09 DIAGNOSIS — J45909 Unspecified asthma, uncomplicated: Secondary | ICD-10-CM | POA: Diagnosis not present

## 2014-12-09 DIAGNOSIS — Z1239 Encounter for other screening for malignant neoplasm of breast: Secondary | ICD-10-CM

## 2014-12-09 DIAGNOSIS — F329 Major depressive disorder, single episode, unspecified: Secondary | ICD-10-CM

## 2014-12-09 DIAGNOSIS — E7800 Pure hypercholesterolemia, unspecified: Secondary | ICD-10-CM

## 2014-12-09 DIAGNOSIS — E119 Type 2 diabetes mellitus without complications: Secondary | ICD-10-CM

## 2014-12-09 DIAGNOSIS — F32A Depression, unspecified: Secondary | ICD-10-CM

## 2014-12-09 DIAGNOSIS — D649 Anemia, unspecified: Secondary | ICD-10-CM

## 2014-12-09 DIAGNOSIS — Z6841 Body Mass Index (BMI) 40.0 and over, adult: Secondary | ICD-10-CM

## 2014-12-09 DIAGNOSIS — Z0000 Encounter for general adult medical examination without abnormal findings: Secondary | ICD-10-CM

## 2014-12-09 DIAGNOSIS — Z9884 Bariatric surgery status: Secondary | ICD-10-CM

## 2014-12-09 LAB — HEPATIC FUNCTION PANEL
ALT: 9 U/L (ref 7–35)
AST: 6 U/L — AB (ref 13–35)
Alkaline Phosphatase: 78 U/L (ref 25–125)
Bilirubin, Total: 0.4 mg/dL

## 2014-12-09 LAB — BASIC METABOLIC PANEL
BUN: 18 mg/dL (ref 4–21)
Creatinine: 0.7 mg/dL (ref 0.5–1.1)
Glucose: 131 mg/dL
POTASSIUM: 3.5 mmol/L (ref 3.4–5.3)
Sodium: 144 mmol/L (ref 137–147)

## 2014-12-09 LAB — CBC AND DIFFERENTIAL
HEMATOCRIT: 37 % (ref 36–46)
Hemoglobin: 11.8 g/dL — AB (ref 12.0–16.0)
Neutrophils Absolute: 7 /uL
Platelets: 313 10*3/uL (ref 150–399)
WBC: 10.2 10^3/mL

## 2014-12-09 LAB — LIPID PANEL
Cholesterol: 177 mg/dL (ref 0–200)
HDL: 71 mg/dL — AB (ref 35–70)
LDL Cholesterol: 84 mg/dL
Triglycerides: 108 mg/dL (ref 40–160)

## 2014-12-09 LAB — HEMOGLOBIN A1C: Hgb A1c MFr Bld: 6.6 % — AB (ref 4.0–6.0)

## 2014-12-09 LAB — TSH: TSH: 4.04 u[IU]/mL (ref 0.41–5.90)

## 2014-12-09 NOTE — Progress Notes (Signed)
Pre visit review using our clinic review tool, if applicable. No additional management support is needed unless otherwise documented below in the visit note. 

## 2014-12-09 NOTE — Progress Notes (Signed)
Patient ID: Glenda Zavala, female   DOB: May 11, 1951, 64 y.o.   MRN: [id number]   Subjective:    Patient ID: Glenda Zavala, female    DOB: 1950-08-31, 64 y.o.   MRN: [id number]  HPI  Patient here for a scheduled follow up.  She has been staying with her mother.  Planning to return home soon.  Her mother-n-law is now in [facility]  Still with increased stress, but appears to have improved some.  States gave blood two weeks ago and hgb was 13.  She is trying to watch her diet.  Has lost weight.  Has been trying to avoid sugars/sweets.  Drinking water.  Breathing doing well.   Is walking.  Planning to f/u with Dr Darnell Level.     Past Medical History  Diagnosis Date  . Hypertension   . Hypercholesterolemia   . Asthma   . Environmental allergies   . PSVT (paroxysmal supraventricular tachycardia)   . GERD (gastroesophageal reflux disease)   . Diabetes mellitus   . COPD (chronic obstructive pulmonary disease)   . Anemia   . Arthritis     Outpatient Encounter Prescriptions as of 12/09/2014  Medication Sig  . acetaminophen (TYLENOL) 650 MG CR tablet Take 650 mg by mouth every 8 (eight) hours as needed.  . Blood Glucose Monitoring Suppl (ONETOUCH VERIO IQ SYSTEM) W/DEVICE KIT AS DIRECTED  . Cholecalciferol (VITAMIN D) 2000 UNITS tablet Take 2,000 Units by mouth daily.  . citalopram (CELEXA) 40 MG tablet Take 1 tablet (40 mg total) by mouth daily.  . CRESTOR 5 MG tablet TAKE ONE TABLET AT BEDTIME  . cyanocobalamin (,VITAMIN B-12,) 1000 MCG/ML injection Inject 1,000 mcg into the muscle every 30 (thirty) days.  . Fe Fum-FePoly-Vit C-Vit B3 (INTEGRA) 62.5-62.5-40-3 MG CAPS TAKE ONE CAPSULE BY MOUTH DAILY  . fluticasone (FLONASE) 50 MCG/ACT nasal spray USE 2 SPRAYS EACH NOSTRIL ONE TIME DAILY  . glucose blood (ONETOUCH VERIO) test strip TEST BLOOD GLUCOSE TWICE DAILY  . hydrochlorothiazide (HYDRODIURIL) 25 MG tablet TAKE ONE (1) TABLET EACH DAY  . Lancets (ACCU-CHEK SOFT TOUCH) lancets Use as  instructed  . metFORMIN (GLUCOPHAGE) 1000 MG tablet TAKE ONE TABLET TWICE DAILY  . montelukast (SINGULAIR) 10 MG tablet Take 10 mg by mouth at bedtime as needed.   . nystatin (MYCOSTATIN) powder APPLY TO AFFECTED AREAS TOPICALLY TWICE DAILY  . nystatin cream (MYCOSTATIN) APPLY TO AFFECTED AREAS TOPICALLY TWICE A DAY  . SPIRIVA HANDIHALER 18 MCG inhalation capsule INHALE CONTENTS OF ONE CAPSULE ONCE A DAY AS DIRECTED  . verapamil (CALAN-SR) 240 MG CR tablet TAKE ONE (1) TABLET BY MOUTH EVERY DAY  . XOPENEX HFA 45 MCG/ACT inhaler INHALE 2 PUFFS AS DIRECTED BEFORE EXERTION AND EVERY 4 HOURS AS NEEDED  . [DISCONTINUED] lisinopril (PRINIVIL,ZESTRIL) 40 MG tablet TAKE ONE TABLET EACH MORNING FOR BLOOD PRESSURE    Review of Systems  Constitutional: Negative for appetite change and unexpected weight change.  HENT: Negative for congestion and sinus pressure.   Respiratory: Negative for cough, chest tightness and shortness of breath.   Cardiovascular: Negative for chest pain, palpitations and leg swelling.  Gastrointestinal: Negative for nausea, vomiting, abdominal pain and diarrhea.  Musculoskeletal:       Still with knee pain.  Is walking.    Skin: Negative for color change and rash.  Neurological: Negative for dizziness, light-headedness and headaches.  Hematological: Negative for adenopathy. Does not bruise/bleed easily.  Psychiatric/Behavioral: Negative for dysphoric mood and agitation.  Appears to be doing better - dealing with stress.        Objective:    Physical Exam  Constitutional: She appears well-developed and well-nourished. No distress.  HENT:  Nose: Nose normal.  Mouth/Throat: Oropharynx is clear and moist.  Neck: Neck supple. No thyromegaly present.  Cardiovascular: Normal rate and regular rhythm.   Pulmonary/Chest: Breath sounds normal. No respiratory distress. She has no wheezes.  Abdominal: Soft. Bowel sounds are normal. There is no tenderness.  Musculoskeletal:  She exhibits no edema or tenderness.  Lymphadenopathy:    She has no cervical adenopathy.  Skin: No rash noted. No erythema.  Psychiatric: She has a normal mood and affect. Her behavior is normal.    BP 113/74 mmHg  Pulse 58  Temp(Src) 98.2 F (36.8 C) (Oral)  Ht _0  (1.549 m)  Wt 310 lb 4 oz (140.728 kg)  BMI 58.65 kg/m2  SpO2 98% Wt Readings from Last 3 Encounters:  12/09/14 310 lb 4 oz (140.728 kg)  08/01/14 315 lb 12 oz (143.223 kg)  03/20/14 323 lb 12 oz (146.852 kg)     Lab Results  Component Value Date   WBC 10.2 12/09/2014   HGB 11.8* 12/09/2014   HCT 37 12/09/2014   PLT 313 12/09/2014   GLUCOSE 93 09/27/2012   CHOL 177 12/09/2014   TRIG 108 12/09/2014   HDL 71* 12/09/2014   LDLCALC 84 12/09/2014   ALT 9 12/09/2014   AST 6* 12/09/2014   NA 144 12/09/2014   K 3.5 12/09/2014   CL 101 09/27/2012   CREATININE 0.7 12/09/2014   BUN 18 12/09/2014   CO2 28 09/27/2012   TSH 4.04 12/09/2014   HGBA1C 6.6* 12/09/2014       Assessment & Plan:   Problem List Items Addressed This Visit    Anemia    Colonoscopy 01/01/13 as outlined.  Recommended f/u colonoscopy in 5-10 years.  Recent hgb wnl.  Recheck.  Follow.        Asthma    Breathing stable.  Follow.       Depression    Doing better.  Dealing with increased stress.  Follow.  On citalopram.        Diabetes mellitus    Has adjusted her diet.  Lost weight.  Plans to continue.  Plans to start exercising more.  Check met b and a1c.        GERD (gastroesophageal reflux disease)    Currently controlled.        Health care maintenance    Physical 03/20/14.  Colonoscopy 12/2012.  Schedule mammogram.        Hx of laparoscopic gastric banding    No acid reflux reported.  Eating.  Adjusting diet.  F/u with Dr Darnell Level.        Hypercholesterolemia    Low cholesterol diet and exercise.  Follow lipid panel.       Hypertension    Blood pressure doing well.  Same medication regimen.  Follow pressures.  Check  metabolic panel.        Knee pain    Increased pain.  Had requested referral to Dr Wynelle Link.  Follow.        Morbid obesity with BMI of 50.0-59.9, adult    Diet and exercise.  She has adjusted her diet.  Is losing some weight.  Follow.         Other Visit Diagnoses    Breast cancer screening    -  Primary  Relevant Orders    MM DIGITAL SCREENING BILATERAL      I spent 25 minutes with the patient and more than 50% of the time was spent in consultation regarding the above.     Einar Pheasant, MD

## 2014-12-11 ENCOUNTER — Encounter: Payer: Self-pay | Admitting: Internal Medicine

## 2014-12-15 ENCOUNTER — Encounter: Payer: Self-pay | Admitting: Internal Medicine

## 2014-12-15 DIAGNOSIS — Z0000 Encounter for general adult medical examination without abnormal findings: Secondary | ICD-10-CM | POA: Insufficient documentation

## 2014-12-15 NOTE — Assessment & Plan Note (Signed)
No acid reflux reported.  Eating.  Adjusting diet.  F/u with Dr Darnell Level.

## 2014-12-15 NOTE — Assessment & Plan Note (Signed)
Increased pain.  Had requested referral to Dr Wynelle Link.  Follow.

## 2014-12-15 NOTE — Assessment & Plan Note (Signed)
Low cholesterol diet and exercise.  Follow lipid panel.   

## 2014-12-15 NOTE — Assessment & Plan Note (Signed)
Doing better.  Dealing with increased stress.  Follow.  On citalopram.

## 2014-12-15 NOTE — Assessment & Plan Note (Signed)
Diet and exercise.  She has adjusted her diet.  Is losing some weight.  Follow.

## 2014-12-15 NOTE — Assessment & Plan Note (Signed)
Has adjusted her diet.  Lost weight.  Plans to continue.  Plans to start exercising more.  Check met b and a1c.

## 2014-12-15 NOTE — Assessment & Plan Note (Signed)
Colonoscopy 01/01/13 as outlined.  Recommended f/u colonoscopy in 5-10 years.  Recent hgb wnl.  Recheck.  Follow.

## 2014-12-15 NOTE — Assessment & Plan Note (Signed)
Currently controlled.

## 2014-12-15 NOTE — Assessment & Plan Note (Signed)
Breathing stable.  Follow.    

## 2014-12-15 NOTE — Assessment & Plan Note (Signed)
Blood pressure doing well.  Same medication regimen.  Follow pressures.  Check metabolic panel.   

## 2014-12-15 NOTE — Assessment & Plan Note (Signed)
Physical 03/20/14.  Colonoscopy 12/2012.  Schedule mammogram.

## 2014-12-17 ENCOUNTER — Telehealth: Payer: Self-pay | Admitting: Internal Medicine

## 2014-12-17 ENCOUNTER — Encounter: Payer: Self-pay | Admitting: *Deleted

## 2014-12-17 NOTE — Telephone Encounter (Signed)
Letter mailed

## 2014-12-17 NOTE — Telephone Encounter (Signed)
Notify pt that I reviewed her labs and her cholesterol looks good.  Her thyroid tests are wnl.  Her overall sugar control is ok (a1c 6.6).  Kidney function tests and liver function tests wnl.

## 2015-01-01 ENCOUNTER — Encounter: Payer: Self-pay | Admitting: Internal Medicine

## 2015-02-04 ENCOUNTER — Other Ambulatory Visit: Payer: Self-pay | Admitting: Internal Medicine

## 2015-02-10 DIAGNOSIS — K089 Disorder of teeth and supporting structures, unspecified: Secondary | ICD-10-CM | POA: Diagnosis not present

## 2015-02-10 DIAGNOSIS — K055 Other periodontal diseases: Secondary | ICD-10-CM | POA: Diagnosis not present

## 2015-03-04 ENCOUNTER — Other Ambulatory Visit: Payer: Self-pay | Admitting: Internal Medicine

## 2015-04-15 ENCOUNTER — Other Ambulatory Visit: Payer: Self-pay | Admitting: Internal Medicine

## 2015-04-15 DIAGNOSIS — Z1231 Encounter for screening mammogram for malignant neoplasm of breast: Secondary | ICD-10-CM

## 2015-04-15 NOTE — Progress Notes (Signed)
Order placed for mammogram.

## 2015-04-16 ENCOUNTER — Encounter (INDEPENDENT_AMBULATORY_CARE_PROVIDER_SITE_OTHER): Payer: Self-pay

## 2015-04-16 ENCOUNTER — Encounter: Payer: Self-pay | Admitting: Internal Medicine

## 2015-04-16 ENCOUNTER — Ambulatory Visit (INDEPENDENT_AMBULATORY_CARE_PROVIDER_SITE_OTHER): Payer: No Typology Code available for payment source | Admitting: Internal Medicine

## 2015-04-16 VITALS — BP 103/65 | HR 76 | Temp 98.3°F | Ht 61.0 in | Wt 310.1 lb

## 2015-04-16 DIAGNOSIS — E7800 Pure hypercholesterolemia, unspecified: Secondary | ICD-10-CM

## 2015-04-16 DIAGNOSIS — J069 Acute upper respiratory infection, unspecified: Secondary | ICD-10-CM

## 2015-04-16 DIAGNOSIS — I10 Essential (primary) hypertension: Secondary | ICD-10-CM

## 2015-04-16 DIAGNOSIS — K219 Gastro-esophageal reflux disease without esophagitis: Secondary | ICD-10-CM

## 2015-04-16 DIAGNOSIS — D649 Anemia, unspecified: Secondary | ICD-10-CM

## 2015-04-16 DIAGNOSIS — F329 Major depressive disorder, single episode, unspecified: Secondary | ICD-10-CM

## 2015-04-16 DIAGNOSIS — J45909 Unspecified asthma, uncomplicated: Secondary | ICD-10-CM | POA: Diagnosis not present

## 2015-04-16 DIAGNOSIS — Z0000 Encounter for general adult medical examination without abnormal findings: Secondary | ICD-10-CM

## 2015-04-16 DIAGNOSIS — E119 Type 2 diabetes mellitus without complications: Secondary | ICD-10-CM

## 2015-04-16 DIAGNOSIS — F32A Depression, unspecified: Secondary | ICD-10-CM

## 2015-04-16 DIAGNOSIS — Z6841 Body Mass Index (BMI) 40.0 and over, adult: Secondary | ICD-10-CM

## 2015-04-16 DIAGNOSIS — I499 Cardiac arrhythmia, unspecified: Secondary | ICD-10-CM

## 2015-04-16 DIAGNOSIS — I493 Ventricular premature depolarization: Secondary | ICD-10-CM

## 2015-04-16 MED ORDER — CEFUROXIME AXETIL 250 MG PO TABS: 250.0000 mg | ORAL_TABLET | Freq: Two times a day (BID) | ORAL | Status: DC

## 2015-04-16 MED ORDER — TIOTROPIUM BROMIDE MONOHYDRATE 18 MCG IN CAPS: ORAL_CAPSULE | RESPIRATORY_TRACT | Status: AC

## 2015-04-16 MED ORDER — LEVALBUTEROL TARTRATE 45 MCG/ACT IN AERO: INHALATION_SPRAY | RESPIRATORY_TRACT | Status: DC

## 2015-04-16 NOTE — Progress Notes (Signed)
Pre-visit discussion using our clinic review tool. No additional management support is needed unless otherwise documented below in the visit note.  

## 2015-04-16 NOTE — Progress Notes (Signed)
Patient ID: Glenda Zavala, female   DOB: [birth year], 64 y.o.   MRN: [id number]   Subjective:    Patient ID: Glenda Zavala, female    DOB: [birth year], 64 y.o.   MRN: [id number]  HPI  Patient here to follow up on her current medical issues as well as for a complete physical exam.  She reports that for the last three weeks, she has noticed increased sneezing and sinus pressure.  Sore scratchy throat.  Increased cough.  Some chest congestion.  Some wheezing.  Worsened over the last few days.  Son recently had pneumonia.  No acid reflux.  Discussed diet and exercise.  Bowels stable.  No increased swelling.  She is still staying with her mother.  Increased stress with family issues.  Feels she is handling things relatively well.     Past Medical History  Diagnosis Date  . Hypertension   . Hypercholesterolemia   . Asthma   . Environmental allergies   . PSVT (paroxysmal supraventricular tachycardia)   . GERD (gastroesophageal reflux disease)   . Diabetes mellitus   . COPD (chronic obstructive pulmonary disease)   . Anemia   . Arthritis    Past Surgical History  Procedure Laterality Date  . Cesarean section    . Tonsillectomy    . Tubal ligation    . Abdominal hysterectomy  1990    secondary to bleeding and hyperplasia  . Laparoscopic gastric banding  2010   Family History  Problem Relation Age of Onset  . Hypercholesterolemia Mother   . Emphysema Maternal Grandfather   . Uterine cancer      aunt  . Breast cancer Neg Hx   . Colon cancer Neg Hx    Social History   Social History  . Marital Status: Married    Spouse Name: N/A  . Number of Children: 2  . Years of Education: N/A   Occupational History  . teacher    Social History Main Topics  . Smoking status: Never Smoker   . Smokeless tobacco: Never Used  . Alcohol Use: No  . Drug Use: No  . Sexual Activity: Not Asked   Other Topics Concern  . None   Social History Narrative    Outpatient Encounter  Prescriptions as of 04/16/2015  Medication Sig  . acetaminophen (TYLENOL) 650 MG CR tablet Take 650 mg by mouth every 8 (eight) hours as needed.  . Blood Glucose Monitoring Suppl (ONETOUCH VERIO IQ SYSTEM) W/DEVICE KIT AS DIRECTED  . Cholecalciferol (VITAMIN D) 2000 UNITS tablet Take 2,000 Units by mouth daily.  . citalopram (CELEXA) 40 MG tablet TAKE ONE (1) TABLET EACH DAY  . CRESTOR 5 MG tablet TAKE ONE TABLET AT BEDTIME  . cyanocobalamin (,VITAMIN B-12,) 1000 MCG/ML injection Inject 1,000 mcg into the muscle every 30 (thirty) days.  . Fe Fum-FePoly-Vit C-Vit B3 (INTEGRA) 62.5-62.5-40-3 MG CAPS TAKE ONE CAPSULE BY MOUTH DAILY  . fluticasone (FLONASE) 50 MCG/ACT nasal spray USE 2 SPRAYS EACH NOSTRIL ONE TIME DAILY  . glucose blood (ONETOUCH VERIO) test strip TEST BLOOD GLUCOSE TWICE DAILY  . hydrochlorothiazide (HYDRODIURIL) 25 MG tablet TAKE ONE (1) TABLET EACH DAY  . Lancets (ACCU-CHEK SOFT TOUCH) lancets Use as instructed  . levalbuterol (XOPENEX HFA) 45 MCG/ACT inhaler INHALE 2 PUFFS AS DIRECTED BEFORE EXERTION AND EVERY 4 HOURS AS NEEDED  . lisinopril (PRINIVIL,ZESTRIL) 40 MG tablet TAKE ONE TABLET EVERY MORNING FOR BLOOD PRESSURE (HIGH)  . metFORMIN (GLUCOPHAGE) 1000 MG tablet TAKE ONE TABLET TWICE  DAILY  . montelukast (SINGULAIR) 10 MG tablet Take 10 mg by mouth at bedtime as needed.   . nystatin (MYCOSTATIN) powder APPLY TO AFFECTED AREAS TOPICALLY TWICE DAILY  . nystatin cream (MYCOSTATIN) APPLY TO AFFECTED AREAS TOPICALLY TWICE A DAY  . tiotropium (SPIRIVA HANDIHALER) 18 MCG inhalation capsule INHALE CONTENTS OF ONE CAPSULE ONCE A DAY AS DIRECTED  . verapamil (CALAN-SR) 240 MG CR tablet TAKE ONE (1) TABLET EACH DAY  . [DISCONTINUED] SPIRIVA HANDIHALER 18 MCG inhalation capsule INHALE CONTENTS OF ONE CAPSULE ONCE A DAY AS DIRECTED  . [DISCONTINUED] XOPENEX HFA 45 MCG/ACT inhaler INHALE 2 PUFFS AS DIRECTED BEFORE EXERTION AND EVERY 4 HOURS AS NEEDED  . cefUROXime (CEFTIN) 250 MG  tablet Take 1 tablet (250 mg total) by mouth 2 (two) times daily with a meal.   No facility-administered encounter medications on file as of 04/16/2015.    Review of Systems  Constitutional: Negative for appetite change and unexpected weight change.  HENT: Positive for congestion (increased sinus congestion and nasal congestion.) and sore throat (describes a sore scratchy throat). Negative for sinus pressure.   Eyes: Negative for pain and visual disturbance.  Respiratory: Positive for cough and wheezing. Negative for chest tightness and shortness of breath.   Cardiovascular: Negative for chest pain, palpitations and leg swelling.  Gastrointestinal: Negative for nausea, vomiting, abdominal pain and diarrhea.  Genitourinary: Negative for dysuria and difficulty urinating.  Musculoskeletal: Negative for back pain and joint swelling.  Skin: Negative for color change and rash.  Neurological: Negative for dizziness, light-headedness and headaches.  Hematological: Negative for adenopathy. Does not bruise/bleed easily.  Psychiatric/Behavioral: Negative for dysphoric mood and agitation.       Objective:    Physical Exam  Constitutional: She is oriented to person, place, and time. She appears well-developed and well-nourished.  HENT:  Mouth/Throat: Oropharynx is clear and moist.  Slightly erythematous turbinates.    Eyes: Right eye exhibits no discharge. Left eye exhibits no discharge. No scleral icterus.  Neck: Neck supple. No thyromegaly present.  Cardiovascular: Normal rate and regular rhythm.   Pulmonary/Chest: Breath sounds normal. No accessory muscle usage. No tachypnea. No respiratory distress. She has no decreased breath sounds. She has no wheezes. She has no rhonchi. Right breast exhibits no inverted nipple, no mass, no nipple discharge and no tenderness (no axillary adenopathy). Left breast exhibits no inverted nipple, no mass, no nipple discharge and no tenderness (no axilarry  adenopathy).  Abdominal: Soft. Bowel sounds are normal. There is no tenderness.  Musculoskeletal: She exhibits no edema or tenderness.  Lymphadenopathy:    She has no cervical adenopathy.  Neurological: She is alert and oriented to person, place, and time.  Skin: Skin is warm. No rash noted.  Psychiatric: She has a normal mood and affect. Her behavior is normal.    BP 103/65 mmHg  Pulse 76  Temp(Src) 98.3 F (36.8 C) (Oral)  Ht '5\' 1"'  (1.549 m)  Wt 310 lb 2 oz (140.672 kg)  BMI 58.63 kg/m2  SpO2 96% Wt Readings from Last 3 Encounters:  04/16/15 310 lb 2 oz (140.672 kg)  12/09/14 310 lb 4 oz (140.728 kg)  08/01/14 315 lb 12 oz (143.223 kg)     Lab Results  Component Value Date   WBC 10.2 12/09/2014   HGB 11.8* 12/09/2014   HCT 37 12/09/2014   PLT 313 12/09/2014   GLUCOSE 93 09/27/2012   CHOL 177 12/09/2014   TRIG 108 12/09/2014   HDL 71* 12/09/2014  LDLCALC 84 12/09/2014   ALT 9 12/09/2014   AST 6* 12/09/2014   NA 144 12/09/2014   K 3.5 12/09/2014   CL 101 09/27/2012   CREATININE 0.7 12/09/2014   BUN 18 12/09/2014   CO2 28 09/27/2012   TSH 4.04 12/09/2014   HGBA1C 6.6* 12/09/2014       Assessment & Plan:   Problem List Items Addressed This Visit    Anemia    Colonoscopy 01/01/13 as outlined in overdue.  Recommended f/u colonoscopy in 5-10 years.  Follow cbc.        Asthma    Breathing has been stable.  Increased congestion and cough now.  Treat with robitussin/mucinex as directed.  Restart inhalers.  Continue singulair.  Follow closely.        Relevant Medications   levalbuterol (XOPENEX HFA) 45 MCG/ACT inhaler   tiotropium (SPIRIVA HANDIHALER) 18 MCG inhalation capsule   Depression    Increased stress as outlined.  On citalopram.  Desires no further intervention.  Follow.       Diabetes mellitus    Discussed low carb diet and exercise.  Follow met b and a1c.  Brought in no sugar readings.  Follow.       GERD (gastroesophageal reflux disease)     Currently controlled.  Follow.       Health care maintenance    Physical today 04/16/15.  Colonoscopy 12/2012.  Recommended f/u colonoscopy in 5-10 years.   Mammogram scheduled.        Hypercholesterolemia    Low cholesterol diet and exercise.  Follow lipid panel.        Hypertension    Blood pressure under good control.  Continue same medication regimen.  Follow pressures.  Follow metabolic panel.        Morbid obesity with BMI of 50.0-59.9, adult    Diet and exercise.  Follow.       PVC's (premature ventricular contractions)    Noticed on exam.  EKG revealed SR with frequent PVC's.  She is asymptomatic.  Overdue f/u with cardiology.  Refer to Dr Ubaldo Glassing for f/u.        URI (upper respiratory infection)    Congestion, cough and wheezing as outlined.  Saline nasal spray, nasacort nasal spray and robitussin/mucinex as directed.  Restart inhalers.  If worsening symptoms or problems,  rx for ceftin given to take if symptoms persist/worsen.        Relevant Medications   cefUROXime (CEFTIN) 250 MG tablet    Other Visit Diagnoses    Irregular heart beat    -  Primary    Relevant Orders    EKG 12-Lead (Completed)    Ambulatory referral to Cardiology        Einar Pheasant, MD

## 2015-04-16 NOTE — Patient Instructions (Signed)
Saline nasal spray - flush nose at least 2-3x/day  nasacort nasal spray - 2 sprays each nostril one time per day.  Do this in the evening.  

## 2015-04-17 ENCOUNTER — Other Ambulatory Visit: Payer: Self-pay | Admitting: *Deleted

## 2015-04-17 ENCOUNTER — Telehealth: Payer: Self-pay | Admitting: *Deleted

## 2015-04-17 MED ORDER — ALBUTEROL SULFATE (2.5 MG/3ML) 0.083% IN NEBU: 2.5000 mg | INHALATION_SOLUTION | Freq: Four times a day (QID) | RESPIRATORY_TRACT | Status: AC | PRN

## 2015-04-17 NOTE — Telephone Encounter (Signed)
Can she use albuterol inhaler.  If has had no problems with this inhaler, can change to albuterol inhaler.

## 2015-04-17 NOTE — Telephone Encounter (Signed)
Pt called states her insurance will not cover Xopenex, she is requesting a different inhaler that will be covered.  Please advise

## 2015-04-17 NOTE — Telephone Encounter (Signed)
Spoke with pt, she states she has not had any problems with Albuterol.  Rx changed

## 2015-04-19 ENCOUNTER — Encounter: Payer: Self-pay | Admitting: Internal Medicine

## 2015-04-19 DIAGNOSIS — I493 Ventricular premature depolarization: Secondary | ICD-10-CM | POA: Insufficient documentation

## 2015-04-19 DIAGNOSIS — J069 Acute upper respiratory infection, unspecified: Secondary | ICD-10-CM | POA: Insufficient documentation

## 2015-04-19 DIAGNOSIS — I4891 Unspecified atrial fibrillation: Secondary | ICD-10-CM | POA: Insufficient documentation

## 2015-04-19 DIAGNOSIS — I4892 Unspecified atrial flutter: Secondary | ICD-10-CM

## 2015-04-19 NOTE — Assessment & Plan Note (Signed)
Colonoscopy 01/01/13 as outlined in overdue.  Recommended f/u colonoscopy in 5-10 years.  Follow cbc.

## 2015-04-19 NOTE — Assessment & Plan Note (Signed)
Congestion, cough and wheezing as outlined.  Saline nasal spray, nasacort nasal spray and robitussin/mucinex as directed.  Restart inhalers.  If worsening symptoms or problems,  rx for ceftin given to take if symptoms persist/worsen.

## 2015-04-19 NOTE — Assessment & Plan Note (Signed)
Currently controlled.  Follow.

## 2015-04-19 NOTE — Assessment & Plan Note (Signed)
Diet and exercise.  Follow.  

## 2015-04-19 NOTE — Assessment & Plan Note (Signed)
Physical today 04/16/15.  Colonoscopy 12/2012.  Recommended f/u colonoscopy in 5-10 years.   Mammogram scheduled.

## 2015-04-19 NOTE — Assessment & Plan Note (Signed)
Noticed on exam.  EKG revealed SR with frequent PVC's.  She is asymptomatic.  Overdue f/u with cardiology.  Refer to Dr Ubaldo Glassing for f/u.

## 2015-04-19 NOTE — Assessment & Plan Note (Signed)
Breathing has been stable.  Increased congestion and cough now.  Treat with robitussin/mucinex as directed.  Restart inhalers.  Continue singulair.  Follow closely.

## 2015-04-19 NOTE — Assessment & Plan Note (Signed)
Discussed low carb diet and exercise.  Follow met b and a1c.  Brought in no sugar readings.  Follow.

## 2015-04-19 NOTE — Assessment & Plan Note (Signed)
Low cholesterol diet and exercise.  Follow lipid panel.   

## 2015-04-19 NOTE — Assessment & Plan Note (Signed)
Increased stress as outlined.  On citalopram.  Desires no further intervention.  Follow.

## 2015-04-19 NOTE — Assessment & Plan Note (Signed)
Blood pressure under good control.  Continue same medication regimen.  Follow pressures.  Follow metabolic panel.   

## 2015-04-22 ENCOUNTER — Ambulatory Visit
Admission: RE | Admit: 2015-04-22 | Discharge: 2015-04-22 | Disposition: A | Discharge status: Home / Self Care | Payer: No Typology Code available for payment source | Source: Ambulatory Visit | Attending: Internal Medicine | Admitting: Internal Medicine

## 2015-04-22 DIAGNOSIS — E780 Pure hypercholesterolemia: Secondary | ICD-10-CM | POA: Diagnosis not present

## 2015-04-22 DIAGNOSIS — I498 Other specified cardiac arrhythmias: Secondary | ICD-10-CM | POA: Diagnosis not present

## 2015-04-22 DIAGNOSIS — Z1231 Encounter for screening mammogram for malignant neoplasm of breast: Secondary | ICD-10-CM

## 2015-04-22 DIAGNOSIS — I10 Essential (primary) hypertension: Secondary | ICD-10-CM | POA: Diagnosis not present

## 2015-04-24 ENCOUNTER — Ambulatory Visit: Admission: RE | Admit: 2015-04-24 | Payer: No Typology Code available for payment source | Source: Ambulatory Visit

## 2015-04-30 ENCOUNTER — Ambulatory Visit: Admission: RE | Admit: 2015-04-30 | Payer: No Typology Code available for payment source | Source: Ambulatory Visit

## 2015-04-30 ENCOUNTER — Telehealth: Payer: Self-pay | Admitting: *Deleted

## 2015-04-30 NOTE — Telephone Encounter (Signed)
May take a while to fully clear.  If persistent symptoms (worsening symptoms), needs reevaluation.

## 2015-04-30 NOTE — Telephone Encounter (Signed)
Spoke with pt, advised of MDs message.  Pt verbalized understanding 

## 2015-04-30 NOTE — Telephone Encounter (Signed)
Pt called states she is still having sore throat, sinus drainage, chest congestion with the antibiotics.  Please advise

## 2015-05-22 ENCOUNTER — Other Ambulatory Visit: Payer: Self-pay | Admitting: Internal Medicine

## 2015-06-03 DIAGNOSIS — Z23 Encounter for immunization: Secondary | ICD-10-CM | POA: Diagnosis not present

## 2015-06-04 LAB — HM DIABETES EYE EXAM

## 2015-06-16 ENCOUNTER — Ambulatory Visit: Payer: Self-pay | Admitting: Internal Medicine

## 2015-07-02 ENCOUNTER — Encounter: Payer: Self-pay | Admitting: Internal Medicine

## 2015-07-07 ENCOUNTER — Other Ambulatory Visit: Payer: Self-pay | Admitting: Internal Medicine

## 2015-07-07 NOTE — Telephone Encounter (Signed)
Please advise refill? 

## 2015-07-07 NOTE — Telephone Encounter (Signed)
Refilled nystatin cream with one refill.

## 2015-08-10 ENCOUNTER — Encounter: Payer: Self-pay | Admitting: Physician Assistant

## 2015-08-10 ENCOUNTER — Other Ambulatory Visit: Payer: Self-pay | Admitting: Internal Medicine

## 2015-08-10 ENCOUNTER — Ambulatory Visit: Payer: Self-pay | Admitting: Physician Assistant

## 2015-08-10 VITALS — BP 140/79 | HR 68 | Temp 97.9°F

## 2015-08-10 DIAGNOSIS — Z299 Encounter for prophylactic measures, unspecified: Secondary | ICD-10-CM

## 2015-08-10 DIAGNOSIS — W57XXXA Bitten or stung by nonvenomous insect and other nonvenomous arthropods, initial encounter: Secondary | ICD-10-CM

## 2015-08-10 DIAGNOSIS — J018 Other acute sinusitis: Secondary | ICD-10-CM

## 2015-08-10 MED ORDER — FLUCONAZOLE 150 MG PO TABS: 150.0000 mg | ORAL_TABLET | Freq: Once | ORAL | Status: DC

## 2015-08-10 MED ORDER — ERYTHROMYCIN 5 MG/GM OP OINT: 1.0000 "application " | TOPICAL_OINTMENT | Freq: Three times a day (TID) | OPHTHALMIC | Status: DC

## 2015-08-10 MED ORDER — CEFDINIR 300 MG PO CAPS: 300.0000 mg | ORAL_CAPSULE | Freq: Two times a day (BID) | ORAL | Status: DC

## 2015-08-10 NOTE — Progress Notes (Signed)
S: C/o sinus pain and  congestion for 3 days, no fever, chills, had dental surgery done and thinks the numbing injection went into her sinus, was on amoxil for 2 weeks prior bc of infection in gum and tooth, denies cp/sob, v/d, c/o of facial and dental pain. Also ? Bug bite beside r eye , been there for a week and won't go away  Using otc meds:   O: PE: vitals wnl, nad, perrl eomi, normocephalic, bump beside r eye with red open center;  tms dull, nasal mucosa red and swollen, throat injected, neck supple no lymph, lungs c t a, cv rrr, neuro intact  A:  Acute sinusitis, ?infected bug bite vs cyst   P: omnicef 300mg  bid, diflucan, e-mycin opth ointment drink fluids, continue regular meds , use otc meds of choice, return if not improving in 5 days, return earlier if worsening

## 2015-08-11 ENCOUNTER — Telehealth: Payer: Self-pay | Admitting: Internal Medicine

## 2015-08-11 DIAGNOSIS — J45909 Unspecified asthma, uncomplicated: Secondary | ICD-10-CM

## 2015-08-11 NOTE — Telephone Encounter (Signed)
Patient states that insurance will not cover the zopenex. The pharmacy gave her albuterol ampules, but patient does not have a ned to use these ampules. Please advise?

## 2015-08-11 NOTE — Telephone Encounter (Signed)
I am not sure what this message means.  Is she not wanting the rx?  Thanks

## 2015-08-12 NOTE — Telephone Encounter (Signed)
Please advise for prescription.  thanks

## 2015-08-12 NOTE — Telephone Encounter (Signed)
Duplicate.  See attached.   

## 2015-08-12 NOTE — Telephone Encounter (Signed)
Order placed for nebulizer.  rx signed and on your desk.

## 2015-08-12 NOTE — Telephone Encounter (Signed)
Faxed

## 2015-08-12 NOTE — Telephone Encounter (Signed)
[facility] [phone] called about pt needing a prescription for a  Nebulizer. Fax [phone]. Pharmacy is [facility], [address]. Thank You!

## 2015-08-12 NOTE — Telephone Encounter (Signed)
Dr Kavin Leech pt called and stated that she now needs a nebulizer now that she has the albuterol ampules. Can you write a script for the Nebuliser or can we give her one from the office?..Marland KitchenMarland KitchenPlease advise.Thanks you.Marland KitchenCarol Ada

## 2015-08-14 ENCOUNTER — Other Ambulatory Visit: Payer: Self-pay

## 2015-08-14 DIAGNOSIS — E119 Type 2 diabetes mellitus without complications: Secondary | ICD-10-CM

## 2015-08-14 DIAGNOSIS — E7800 Pure hypercholesterolemia, unspecified: Secondary | ICD-10-CM

## 2015-08-14 DIAGNOSIS — D649 Anemia, unspecified: Secondary | ICD-10-CM

## 2015-08-14 NOTE — Progress Notes (Signed)
Patient came in to have blood drawn per her physician, Dr. Einar Pheasant at [facility].  Blood was drawn from the right arm without any incident.  Patient wants lab results sent to Dr. Nicki Reaper when they are finalized.

## 2015-08-15 LAB — CMP12+LP+TP+TSH+6AC+CBC/D/PLT
ALBUMIN: 4 g/dL (ref 3.6–4.8)
ALK PHOS: 74 IU/L (ref 39–117)
ALT: 11 IU/L (ref 0–32)
AST: 6 IU/L (ref 0–40)
Albumin/Globulin Ratio: 1.6 (ref 1.1–2.5)
BASOS: 1 %
BILIRUBIN TOTAL: 0.4 mg/dL (ref 0.0–1.2)
BUN/Creatinine Ratio: 22 (ref 11–26)
BUN: 16 mg/dL (ref 8–27)
Basophils Absolute: 0 10*3/uL (ref 0.0–0.2)
CALCIUM: 9.3 mg/dL (ref 8.7–10.3)
CHLORIDE: 101 mmol/L (ref 96–106)
CHOL/HDL RATIO: 3.1 ratio (ref 0.0–4.4)
CREATININE: 0.72 mg/dL (ref 0.57–1.00)
Cholesterol, Total: 184 mg/dL (ref 100–199)
EOS (ABSOLUTE): 0.3 10*3/uL (ref 0.0–0.4)
EOS: 5 %
FREE THYROXINE INDEX: 2.6 (ref 1.2–4.9)
GFR calc Af Amer: 104 mL/min/{1.73_m2} (ref >59)
GFR calc non Af Amer: 90 mL/min/{1.73_m2} (ref >59)
GGT: 13 IU/L (ref 0–60)
GLUCOSE: 110 mg/dL — AB (ref 65–99)
Globulin, Total: 2.5 g/dL (ref 1.5–4.5)
HDL: 60 mg/dL (ref >39)
HEMATOCRIT: 38.9 % (ref 34.0–46.6)
HEMOGLOBIN: 12.5 g/dL (ref 11.1–15.9)
IMMATURE GRANULOCYTES: 0 %
Immature Grans (Abs): 0 10*3/uL (ref 0.0–0.1)
Iron: 65 ug/dL (ref 27–139)
LDH: 174 IU/L (ref 119–226)
LDL CALC: 110 mg/dL — AB (ref 0–99)
LYMPHS ABS: 1.6 10*3/uL (ref 0.7–3.1)
Lymphs: 24 %
MCH: 25.8 pg — AB (ref 26.6–33.0)
MCHC: 32.1 g/dL (ref 31.5–35.7)
MCV: 80 fL (ref 79–97)
Monocytes Absolute: 0.4 10*3/uL (ref 0.1–0.9)
Monocytes: 7 %
NEUTROS PCT: 63 %
Neutrophils Absolute: 4.2 10*3/uL (ref 1.4–7.0)
POTASSIUM: 4.2 mmol/L (ref 3.5–5.2)
Phosphorus: 3.8 mg/dL (ref 2.5–4.5)
Platelets: 307 10*3/uL (ref 150–379)
RBC: 4.85 x10E6/uL (ref 3.77–5.28)
RDW: 14.3 % (ref 12.3–15.4)
Sodium: 142 mmol/L (ref 134–144)
T3 Uptake Ratio: 28 % (ref 24–39)
T4, Total: 9.2 ug/dL (ref 4.5–12.0)
TSH: 3.34 u[IU]/mL (ref 0.450–4.500)
Total Protein: 6.5 g/dL (ref 6.0–8.5)
Triglycerides: 69 mg/dL (ref 0–149)
URIC ACID: 5.6 mg/dL (ref 2.5–7.1)
VLDL CHOLESTEROL CAL: 14 mg/dL (ref 5–40)
WBC: 6.7 10*3/uL (ref 3.4–10.8)

## 2015-08-15 LAB — FERRITIN: Ferritin: 31 ng/mL (ref 15–150)

## 2015-08-15 LAB — HGB A1C W/O EAG: Hgb A1c MFr Bld: 6.5 % — ABNORMAL HIGH (ref 4.8–5.6)

## 2015-08-18 ENCOUNTER — Encounter: Payer: Self-pay | Admitting: Internal Medicine

## 2015-08-18 ENCOUNTER — Ambulatory Visit (INDEPENDENT_AMBULATORY_CARE_PROVIDER_SITE_OTHER): Payer: No Typology Code available for payment source | Admitting: Internal Medicine

## 2015-08-18 VITALS — BP 116/70 | HR 59 | Temp 98.1°F | Ht 61.0 in | Wt 312.0 lb

## 2015-08-18 DIAGNOSIS — K219 Gastro-esophageal reflux disease without esophagitis: Secondary | ICD-10-CM | POA: Diagnosis not present

## 2015-08-18 DIAGNOSIS — I10 Essential (primary) hypertension: Secondary | ICD-10-CM

## 2015-08-18 DIAGNOSIS — J329 Chronic sinusitis, unspecified: Secondary | ICD-10-CM

## 2015-08-18 DIAGNOSIS — E669 Obesity, unspecified: Secondary | ICD-10-CM

## 2015-08-18 DIAGNOSIS — J45909 Unspecified asthma, uncomplicated: Secondary | ICD-10-CM | POA: Diagnosis not present

## 2015-08-18 DIAGNOSIS — E7800 Pure hypercholesterolemia, unspecified: Secondary | ICD-10-CM

## 2015-08-18 DIAGNOSIS — Z1239 Encounter for other screening for malignant neoplasm of breast: Secondary | ICD-10-CM

## 2015-08-18 DIAGNOSIS — Z9884 Bariatric surgery status: Secondary | ICD-10-CM

## 2015-08-18 DIAGNOSIS — D649 Anemia, unspecified: Secondary | ICD-10-CM

## 2015-08-18 DIAGNOSIS — E119 Type 2 diabetes mellitus without complications: Secondary | ICD-10-CM | POA: Diagnosis not present

## 2015-08-18 MED ORDER — ALBUTEROL SULFATE HFA 108 (90 BASE) MCG/ACT IN AERS: 2.0000 | INHALATION_SPRAY | Freq: Four times a day (QID) | RESPIRATORY_TRACT | Status: DC | PRN

## 2015-08-18 NOTE — Progress Notes (Signed)
Pre visit review using our clinic review tool, if applicable. No additional management support is needed unless otherwise documented below in the visit note. 

## 2015-08-18 NOTE — Progress Notes (Signed)
Patient ID: Glenda Zavala, female   DOB: 1951/02/08, 64 y.o.   MRN: [id number]   Subjective:    Patient ID: Glenda Zavala, female    DOB: Dec 17, 1950, 64 y.o.   MRN: [id number]  HPI  Patient with past history of hypercholesterolemia, diabetes, hypertension, anemia and GERD.  She comes in today to follow up on these issues.  She is accompanied by her daughter.  History obtained from both of them.  States in November, she had an abscess - tooth/gum.  Had injection into the abscess.  Feels sinus infection started after this procedure.  Took abx for two weeks.  Had persistent symptoms.  Saw Ashok Cordia.  On abx now.  Symptoms improved now.  Feels better.  Feels her breathing is getting back to baseline.  No chest pain or tightness.  No acid reflux.  No abdominal pain or cramping.  Bowels stable.  Sleeping better.     Past Medical History  Diagnosis Date  . Hypertension   . Hypercholesterolemia   . Asthma   . Environmental allergies   . PSVT (paroxysmal supraventricular tachycardia) ([place])   . GERD (gastroesophageal reflux disease)   . Diabetes mellitus ([place])   . COPD (chronic obstructive pulmonary disease) ([place])   . Anemia   . Arthritis    Past Surgical History  Procedure Laterality Date  . Cesarean section    . Tonsillectomy    . Tubal ligation    . Abdominal hysterectomy  1990    secondary to bleeding and hyperplasia  . Laparoscopic gastric banding  2010   Family History  Problem Relation Age of Onset  . Hypercholesterolemia Mother   . Emphysema Maternal Grandfather   . Uterine cancer      aunt  . Breast cancer Neg Hx   . Colon cancer Neg Hx    Social History   Social History  . Marital Status: Married    Spouse Name: N/A  . Number of Children: 2  . Years of Education: N/A   Occupational History  . teacher    Social History Main Topics  . Smoking status: Never Smoker   . Smokeless tobacco: Never Used  . Alcohol Use: No  . Drug Use: No  . Sexual  Activity: Not Asked   Other Topics Concern  . None   Social History Narrative    Outpatient Encounter Prescriptions as of 08/18/2015  Medication Sig  . acetaminophen (TYLENOL) 650 MG CR tablet Take 650 mg by mouth every 8 (eight) hours as needed.  Marland Kitchen albuterol (PROVENTIL) (2.5 MG/3ML) 0.083% nebulizer solution Take 3 mLs (2.5 mg total) by nebulization every 6 (six) hours as needed for shortness of breath.  . Blood Glucose Monitoring Suppl (ONETOUCH VERIO IQ SYSTEM) W/DEVICE KIT AS DIRECTED  . cefdinir (OMNICEF) 300 MG capsule Take 1 capsule (300 mg total) by mouth 2 (two) times daily.  . Cholecalciferol (VITAMIN D) 2000 UNITS tablet Take 2,000 Units by mouth daily.  . citalopram (CELEXA) 40 MG tablet TAKE ONE (1) TABLET EACH DAY  . cyanocobalamin (,VITAMIN B-12,) 1000 MCG/ML injection Inject 1,000 mcg into the muscle every 30 (thirty) days.  Marland Kitchen erythromycin ophthalmic ointment Place 1 application into the right eye 3 (three) times daily.  . Fe Fum-FePoly-Vit C-Vit B3 (INTEGRA) 62.5-62.5-40-3 MG CAPS TAKE ONE CAPSULE BY MOUTH DAILY  . fluconazole (DIFLUCAN) 150 MG tablet Take 1 tablet (150 mg total) by mouth once.  . fluticasone (FLONASE) 50 MCG/ACT nasal spray USE 2 SPRAYS EACH NOSTRIL  ONE TIME DAILY  . glucose blood (ONETOUCH VERIO) test strip TEST BLOOD GLUCOSE TWICE DAILY  . hydrochlorothiazide (HYDRODIURIL) 25 MG tablet TAKE ONE (1) TABLET EACH DAY  . Lancets (ACCU-CHEK SOFT TOUCH) lancets Use as instructed  . lisinopril (PRINIVIL,ZESTRIL) 40 MG tablet TAKE ONE TABLET BY MOUTH EVERY MORNING FOR HIGH BLOOD PRESSURE  . montelukast (SINGULAIR) 10 MG tablet Take 10 mg by mouth at bedtime as needed.   . nystatin (MYCOSTATIN) powder APPLY TO AFFECTED AREAS TWICE DAILY  . nystatin cream (MYCOSTATIN) APPLY TO AFFECTED AREAS TOPICALLY TWICE A DAY  . rosuvastatin (CRESTOR) 5 MG tablet TAKE ONE TABLET BY MOUTH EVERY NIGHT AT BEDTIME  . tiotropium (SPIRIVA HANDIHALER) 18 MCG inhalation capsule  INHALE CONTENTS OF ONE CAPSULE ONCE A DAY AS DIRECTED  . verapamil (CALAN-SR) 240 MG CR tablet TAKE ONE (1) TABLET EACH DAY  . [DISCONTINUED] cefUROXime (CEFTIN) 250 MG tablet Take 1 tablet (250 mg total) by mouth 2 (two) times daily with a meal.  . [DISCONTINUED] levalbuterol (XOPENEX HFA) 45 MCG/ACT inhaler INHALE 2 PUFFS AS DIRECTED BEFORE EXERTION AND EVERY 4 HOURS AS NEEDED  . [DISCONTINUED] metFORMIN (GLUCOPHAGE) 1000 MG tablet TAKE ONE TABLET TWICE DAILY  . albuterol (PROVENTIL HFA;VENTOLIN HFA) 108 (90 Base) MCG/ACT inhaler Inhale 2 puffs into the lungs every 6 (six) hours as needed for wheezing or shortness of breath.   No facility-administered encounter medications on file as of 08/18/2015.    Review of Systems  Constitutional: Negative for fever and appetite change.  HENT: Positive for congestion and sinus pressure.        Symptoms improving now.  Feeling like she is getting back to her baseline.    Eyes: Negative for discharge and redness.  Respiratory: Negative for cough, chest tightness and shortness of breath.   Cardiovascular: Negative for chest pain and palpitations.  Gastrointestinal: Negative for nausea, vomiting, abdominal pain and diarrhea.  Genitourinary: Negative for dysuria and difficulty urinating.  Musculoskeletal: Negative for myalgias and joint swelling.  Skin: Negative for color change and rash.  Neurological: Negative for dizziness, light-headedness and headaches.  Psychiatric/Behavioral: Negative for dysphoric mood and agitation.       Objective:    Physical Exam  Constitutional: She appears well-developed and well-nourished. No distress.  HENT:  Nose: Nose normal.  Mouth/Throat: Oropharynx is clear and moist.  Eyes: Conjunctivae are normal. Right eye exhibits no discharge. Left eye exhibits no discharge.  Neck: Neck supple. No thyromegaly present.  Cardiovascular: Normal rate and regular rhythm.   Pulmonary/Chest: Breath sounds normal. No  respiratory distress. She has no wheezes.  Abdominal: Soft. Bowel sounds are normal. There is no tenderness.  Musculoskeletal: She exhibits no tenderness.  No increased swelling.  Stable.    Lymphadenopathy:    She has no cervical adenopathy.  Skin: Skin is warm and dry. No rash noted.  Psychiatric: She has a normal mood and affect. Her behavior is normal.    BP 116/70 mmHg  Pulse 59  Temp(Src) 98.1 F (36.7 C) (Oral)  Ht 5' 1" (1.549 m)  Wt 312 lb (141.522 kg)  BMI 58.98 kg/m2  SpO2 97% Wt Readings from Last 3 Encounters:  08/18/15 312 lb (141.522 kg)  04/16/15 310 lb 2 oz (140.672 kg)  12/09/14 310 lb 4 oz (140.728 kg)     Lab Results  Component Value Date   WBC 6.7 08/14/2015   HGB 11.8* 12/09/2014   HCT 38.9 08/14/2015   PLT 313 12/09/2014   GLUCOSE 110* 08/14/2015  CHOL 184 08/14/2015   TRIG 69 08/14/2015   HDL 60 08/14/2015   LDLCALC 110* 08/14/2015   ALT 11 08/14/2015   AST 6 08/14/2015   NA 142 08/14/2015   K 4.2 08/14/2015   CL 101 08/14/2015   CREATININE 0.72 08/14/2015   BUN 16 08/14/2015   CO2 28 09/27/2012   TSH 3.340 08/14/2015   HGBA1C 6.5* 08/14/2015   MICROALBUR <0.7 08/19/2015       Assessment & Plan:   Problem List Items Addressed This Visit    Anemia    Colonoscopy 5/13/4 - diverticulosis, lipoma.  Recommended f/u colonoscopy 5-10 years.        Asthma    Overall breathing has been stable.  Sinus infection being treated and improved.  Follow.        Relevant Medications   albuterol (PROVENTIL HFA;VENTOLIN HFA) 108 (90 Base) MCG/ACT inhaler   Breast cancer screening    Mammogram ordered.  She cancelled.  Need to reschedule mammogram.       Diabetes mellitus ([place]) - Primary    Low carb diet and exercise.  Follow met b and a1c.        Relevant Orders   Microalbumin / creatinine urine ratio (Completed)   GERD (gastroesophageal reflux disease)    Symptoms controlled.  Follow.        Hx of laparoscopic gastric banding    No  acid reflux reported.  Swallowing ok.        Hypercholesterolemia    Low cholesterol diet and exercise.  On crestor.  Follow lipid panel and liver function tests.        Hypertension    Blood pressure under good control.  Continue same medication regimen.  Follow pressures.  Follow metabolic panel.        Obesity    Diet and exercise.        Sinusitis    On abx now.  Symptoms improved. Complete abx.  Follow.            Einar Pheasant, MD

## 2015-08-19 LAB — MICROALBUMIN / CREATININE URINE RATIO
CREATININE, U: 153.2 mg/dL
MICROALB/CREAT RATIO: 0.5 mg/g (ref 0.0–30.0)
Microalb, Ur: <0.7 mg/dL (ref 0.0–1.9)

## 2015-08-20 ENCOUNTER — Other Ambulatory Visit: Payer: Self-pay | Admitting: Internal Medicine

## 2015-08-20 ENCOUNTER — Encounter: Payer: Self-pay | Admitting: Internal Medicine

## 2015-08-21 ENCOUNTER — Encounter: Payer: Self-pay | Admitting: Internal Medicine

## 2015-08-21 DIAGNOSIS — Z1239 Encounter for other screening for malignant neoplasm of breast: Secondary | ICD-10-CM | POA: Insufficient documentation

## 2015-08-21 DIAGNOSIS — J329 Chronic sinusitis, unspecified: Secondary | ICD-10-CM | POA: Insufficient documentation

## 2015-08-21 NOTE — Assessment & Plan Note (Signed)
Diet and exercise.   

## 2015-08-21 NOTE — Assessment & Plan Note (Signed)
Low cholesterol diet and exercise.  On crestor.  Follow lipid panel and liver function tests.  

## 2015-08-21 NOTE — Assessment & Plan Note (Signed)
No acid reflux reported.  Swallowing ok.

## 2015-08-21 NOTE — Assessment & Plan Note (Addendum)
Mammogram ordered.  She cancelled.  Need to reschedule mammogram.

## 2015-08-21 NOTE — Assessment & Plan Note (Signed)
Colonoscopy 5/13/4 - diverticulosis, lipoma.  Recommended f/u colonoscopy 5-10 years.

## 2015-08-21 NOTE — Assessment & Plan Note (Signed)
Low carb diet and exercise.  Follow met b and a1c.   

## 2015-08-21 NOTE — Assessment & Plan Note (Signed)
Overall breathing has been stable.  Sinus infection being treated and improved.  Follow.

## 2015-08-21 NOTE — Assessment & Plan Note (Signed)
Blood pressure under good control.  Continue same medication regimen.  Follow pressures.  Follow metabolic panel.   

## 2015-08-21 NOTE — Assessment & Plan Note (Signed)
Symptoms controlled.  Follow.   

## 2015-08-21 NOTE — Assessment & Plan Note (Signed)
On abx now.  Symptoms improved. Complete abx.  Follow.

## 2015-08-26 ENCOUNTER — Ambulatory Visit
Admission: RE | Admit: 2015-08-26 | Discharge: 2015-08-26 | Disposition: A | Discharge status: Home / Self Care | Payer: 59 | Source: Ambulatory Visit | Attending: Internal Medicine | Admitting: Internal Medicine

## 2015-08-26 ENCOUNTER — Other Ambulatory Visit: Payer: Self-pay | Admitting: Internal Medicine

## 2015-08-26 DIAGNOSIS — Z1231 Encounter for screening mammogram for malignant neoplasm of breast: Secondary | ICD-10-CM | POA: Diagnosis not present

## 2015-08-26 NOTE — Telephone Encounter (Signed)
Unread mychart message mailed to patient 

## 2015-09-18 ENCOUNTER — Other Ambulatory Visit: Payer: Self-pay | Admitting: Internal Medicine

## 2015-10-23 ENCOUNTER — Other Ambulatory Visit: Payer: Self-pay | Admitting: Internal Medicine

## 2015-12-03 DIAGNOSIS — L821 Other seborrheic keratosis: Secondary | ICD-10-CM | POA: Diagnosis not present

## 2015-12-03 DIAGNOSIS — L814 Other melanin hyperpigmentation: Secondary | ICD-10-CM | POA: Diagnosis not present

## 2015-12-03 DIAGNOSIS — X32XXXA Exposure to sunlight, initial encounter: Secondary | ICD-10-CM | POA: Diagnosis not present

## 2015-12-17 ENCOUNTER — Encounter: Payer: Self-pay | Admitting: Internal Medicine

## 2015-12-17 ENCOUNTER — Ambulatory Visit (INDEPENDENT_AMBULATORY_CARE_PROVIDER_SITE_OTHER): Payer: Managed Care, Other (non HMO) | Admitting: Internal Medicine

## 2015-12-17 VITALS — BP 128/70 | HR 69 | Temp 98.3°F | Resp 18 | Ht 61.0 in | Wt 311.0 lb

## 2015-12-17 DIAGNOSIS — K219 Gastro-esophageal reflux disease without esophagitis: Secondary | ICD-10-CM

## 2015-12-17 DIAGNOSIS — E7800 Pure hypercholesterolemia, unspecified: Secondary | ICD-10-CM

## 2015-12-17 DIAGNOSIS — R42 Dizziness and giddiness: Secondary | ICD-10-CM

## 2015-12-17 DIAGNOSIS — E119 Type 2 diabetes mellitus without complications: Secondary | ICD-10-CM | POA: Diagnosis not present

## 2015-12-17 DIAGNOSIS — F32A Depression, unspecified: Secondary | ICD-10-CM

## 2015-12-17 DIAGNOSIS — Z6841 Body Mass Index (BMI) 40.0 and over, adult: Secondary | ICD-10-CM

## 2015-12-17 DIAGNOSIS — I10 Essential (primary) hypertension: Secondary | ICD-10-CM

## 2015-12-17 DIAGNOSIS — Z9884 Bariatric surgery status: Secondary | ICD-10-CM

## 2015-12-17 DIAGNOSIS — F329 Major depressive disorder, single episode, unspecified: Secondary | ICD-10-CM

## 2015-12-17 DIAGNOSIS — J45909 Unspecified asthma, uncomplicated: Secondary | ICD-10-CM

## 2015-12-17 DIAGNOSIS — D649 Anemia, unspecified: Secondary | ICD-10-CM

## 2015-12-17 NOTE — Progress Notes (Signed)
Pre-visit discussion using our clinic review tool. No additional management support is needed unless otherwise documented below in the visit note.  

## 2015-12-17 NOTE — Progress Notes (Signed)
Patient ID: Glenda Zavala, female   DOB: [birth year], 65 y.o.   MRN: [id number]   Subjective:    Patient ID: Glenda Zavala, female    DOB: 13-Dec-1950, 65 y.o.   MRN: [id number]  HPI  Patient here for a scheduled follow up.  She reports that she had dizziness before Christmas.  Some associated nausea and dry heaves.  This resolved.  The first of 09/2015 she was riding and felt "car sick".  She then felt dizzy and again had nausea and dry heaves.  Then 10 days ago, she had an odd feeling in her head.  Episode not as bad as previous episodes.  Took meclizine.  Is better now.  No chest pain.  Breathing stable.  No acid reflux.  No abdominal pain or cramping.  Bowels stable.     Past Medical History  Diagnosis Date  . Hypertension   . Hypercholesterolemia   . Asthma   . Environmental allergies   . PSVT (paroxysmal supraventricular tachycardia) ([place])   . GERD (gastroesophageal reflux disease)   . Diabetes mellitus ([place])   . COPD (chronic obstructive pulmonary disease) ([place])   . Anemia   . Arthritis    Past Surgical History  Procedure Laterality Date  . Cesarean section    . Tonsillectomy    . Tubal ligation    . Abdominal hysterectomy  1990    secondary to bleeding and hyperplasia  . Laparoscopic gastric banding  2010   Family History  Problem Relation Age of Onset  . Hypercholesterolemia Mother   . Emphysema Maternal Grandfather   . Uterine cancer      aunt  . Breast cancer Neg Hx   . Colon cancer Neg Hx    Social History   Social History  . Marital Status: Married    Spouse Name: N/A  . Number of Children: 2  . Years of Education: N/A   Occupational History  . teacher    Social History Main Topics  . Smoking status: Never Smoker   . Smokeless tobacco: Never Used  . Alcohol Use: No  . Drug Use: No  . Sexual Activity: Not Asked   Other Topics Concern  . None   Social History Narrative    Outpatient Encounter Prescriptions as of 12/17/2015    Medication Sig  . acetaminophen (TYLENOL) 650 MG CR tablet Take 650 mg by mouth every 8 (eight) hours as needed.  Marland Kitchen albuterol (PROVENTIL HFA;VENTOLIN HFA) 108 (90 Base) MCG/ACT inhaler Inhale 2 puffs into the lungs every 6 (six) hours as needed for wheezing or shortness of breath.  Marland Kitchen albuterol (PROVENTIL) (2.5 MG/3ML) 0.083% nebulizer solution Take 3 mLs (2.5 mg total) by nebulization every 6 (six) hours as needed for shortness of breath.  . Blood Glucose Monitoring Suppl (ONETOUCH VERIO IQ SYSTEM) W/DEVICE KIT AS DIRECTED  . Cholecalciferol (VITAMIN D) 2000 UNITS tablet Take 2,000 Units by mouth daily.  . citalopram (CELEXA) 40 MG tablet TAKE ONE (1) TABLET BY MOUTH EVERY DAY  . cyanocobalamin (,VITAMIN B-12,) 1000 MCG/ML injection Inject 1,000 mcg into the muscle every 30 (thirty) days.  . Fe Fum-FePoly-Vit C-Vit B3 (INTEGRA) 62.5-62.5-40-3 MG CAPS TAKE ONE CAPSULE BY MOUTH DAILY  . fluconazole (DIFLUCAN) 150 MG tablet Take 1 tablet (150 mg total) by mouth once.  . fluticasone (FLONASE) 50 MCG/ACT nasal spray USE 2 SPRAYS EACH NOSTRIL ONE TIME DAILY  . glucose blood (ONETOUCH VERIO) test strip TEST BLOOD GLUCOSE TWICE DAILY  . hydrochlorothiazide (HYDRODIURIL) 25  MG tablet TAKE ONE (1) TABLET EACH DAY  . Lancets (ACCU-CHEK SOFT TOUCH) lancets Use as instructed  . lisinopril (PRINIVIL,ZESTRIL) 40 MG tablet TAKE ONE TABLET BY MOUTH EVERY MORNING FOR HIGH BLOOD PRESSURE  . metFORMIN (GLUCOPHAGE) 1000 MG tablet TAKE ONE (1) TABLET BY MOUTH TWO (2) TIMES DAILY  . montelukast (SINGULAIR) 10 MG tablet Take 10 mg by mouth at bedtime as needed.   . nystatin (MYCOSTATIN) powder APPLY TO AFFECTED AREAS TWICE DAILY  . nystatin cream (MYCOSTATIN) APPLY TO AFFECTED AREAS TOPICALLY TWICE A DAY  . rosuvastatin (CRESTOR) 5 MG tablet TAKE ONE TABLET BY MOUTH EVERY NIGHT AT BEDTIME  . tiotropium (SPIRIVA HANDIHALER) 18 MCG inhalation capsule INHALE CONTENTS OF ONE CAPSULE ONCE A DAY AS DIRECTED  . verapamil  (CALAN-SR) 240 MG CR tablet TAKE ONE (1) TABLET EACH DAY  . [DISCONTINUED] cefdinir (OMNICEF) 300 MG capsule Take 1 capsule (300 mg total) by mouth 2 (two) times daily.  . [DISCONTINUED] erythromycin ophthalmic ointment Place 1 application into the right eye 3 (three) times daily.   No facility-administered encounter medications on file as of 12/17/2015.    Review of Systems  Constitutional: Negative for appetite change and unexpected weight change.  HENT: Negative for congestion and sinus pressure.   Respiratory: Negative for cough, chest tightness and shortness of breath.   Cardiovascular: Negative for chest pain and palpitations.  Gastrointestinal: Negative for abdominal pain and diarrhea.       Some nausea and dry heaves associated with dizziness.   Genitourinary: Negative for dysuria and difficulty urinating.  Musculoskeletal:       Knee pain is an issue for her.    Skin: Negative for color change and rash.  Neurological: Positive for dizziness and light-headedness. Negative for headaches.  Psychiatric/Behavioral: Negative for dysphoric mood and agitation.       Objective:    Physical Exam  Constitutional: She appears well-developed and well-nourished.  HENT:  Nose: Nose normal.  Mouth/Throat: Oropharynx is clear and moist.  Neck: Neck supple. No thyromegaly present.  Cardiovascular: Normal rate and regular rhythm.   Pulmonary/Chest: Breath sounds normal. No respiratory distress. She has no wheezes.  Abdominal: Soft. Bowel sounds are normal. There is no tenderness.  Musculoskeletal: She exhibits no tenderness.  No increased swelling.  Stable.   Lymphadenopathy:    She has no cervical adenopathy.  Skin: No rash noted. No erythema.  Psychiatric: She has a normal mood and affect. Her behavior is normal.    BP 128/70 mmHg  Pulse 69  Temp(Src) 98.3 F (36.8 C) (Oral)  Resp 18  Ht _0  (1.549 m)  Wt 311 lb (141.069 kg)  BMI 58.79 kg/m2  SpO2 95% Wt Readings from Last  3 Encounters:  12/17/15 311 lb (141.069 kg)  08/18/15 312 lb (141.522 kg)  04/16/15 310 lb 2 oz (140.672 kg)     Lab Results  Component Value Date   WBC 6.7 08/14/2015   HGB 11.8* 12/09/2014   HCT 38.9 08/14/2015   PLT 307 08/14/2015   GLUCOSE 110* 08/14/2015   CHOL 184 08/14/2015   TRIG 69 08/14/2015   HDL 60 08/14/2015   LDLCALC 110* 08/14/2015   ALT 11 08/14/2015   AST 6 08/14/2015   NA 142 08/14/2015   K 4.2 08/14/2015   CL 101 08/14/2015   CREATININE 0.72 08/14/2015   BUN 16 08/14/2015   CO2 28 09/27/2012   TSH 3.340 08/14/2015   HGBA1C 6.5* 08/14/2015   MICROALBUR <0.7 08/19/2015  Mm Screening Breast Tomo Bilateral  08/27/2015  CLINICAL DATA:  Screening. EXAM: DIGITAL SCREENING BILATERAL MAMMOGRAM WITH 3D TOMO WITH CAD COMPARISON:  Previous exam(s). ACR Breast Density Category b: There are scattered areas of fibroglandular density. FINDINGS: There are no findings suspicious for malignancy. Images were processed with CAD. IMPRESSION: No mammographic evidence of malignancy. A result letter of this screening mammogram will be mailed directly to the patient. RECOMMENDATION: Screening mammogram in one year. (Code:SM-B-01Y) BI-RADS CATEGORY  1: Negative. Electronically Signed   By: Ammie Ferrier M.D.   On: 08/27/2015 08:41       Assessment & Plan:   Problem List Items Addressed This Visit    Anemia    Colonoscopy 01/01/13 - as outlined.  Recommended f/u colonoscopy in 5-10 years.        Asthma    Breathing stable.       Depression    Feels she is handling things relatively well.  On citalopram.  Follow.       Diabetes mellitus ([place])    Low carb diet and exercise.  On metformin.  Follow met b and a1c.        Dizziness    Had the episodes as outlined.  No sinus issues.  No headache.  Movement aggravated.  Described room spinning.  Discussed further w/up including ENT evaluation, MRI, etc.  She declines further w/up.  Wants to follow.        GERD  (gastroesophageal reflux disease)    Symptoms controlled.  Follow.       Hx of laparoscopic gastric banding    Swallowing ok.  Follow cbc.       Hypercholesterolemia    On crestor.  Low cholesterol diet and exercise.  Follow lipid panel and liver function tests.        Hypertension - Primary    Blood pressure under good control.  Continue same medication regimen.  Follow pressures.  Follow metabolic panel.        Morbid obesity with BMI of 50.0-59.9, adult ([place])    Discussed diet and exercise.  Follow.           Einar Pheasant, MD

## 2015-12-20 ENCOUNTER — Encounter: Payer: Self-pay | Admitting: Internal Medicine

## 2015-12-20 DIAGNOSIS — R42 Dizziness and giddiness: Secondary | ICD-10-CM | POA: Insufficient documentation

## 2015-12-20 NOTE — Assessment & Plan Note (Signed)
Breathing stable.

## 2015-12-20 NOTE — Assessment & Plan Note (Signed)
Discussed diet and exercise.  Follow.  

## 2015-12-20 NOTE — Assessment & Plan Note (Signed)
Symptoms controlled.  Follow.   

## 2015-12-20 NOTE — Assessment & Plan Note (Signed)
Had the episodes as outlined.  No sinus issues.  No headache.  Movement aggravated.  Described room spinning.  Discussed further w/up including ENT evaluation, MRI, etc.  She declines further w/up.  Wants to follow.

## 2015-12-20 NOTE — Assessment & Plan Note (Signed)
Swallowing ok.  Follow cbc.

## 2015-12-20 NOTE — Assessment & Plan Note (Signed)
Low carb diet and exercise.  On metformin.  Follow met b and a1c.

## 2015-12-20 NOTE — Assessment & Plan Note (Signed)
Colonoscopy 01/01/13 - as outlined.  Recommended f/u colonoscopy in 5-10 years.

## 2015-12-20 NOTE — Assessment & Plan Note (Signed)
Feels she is handling things relatively well.  On citalopram.  Follow.

## 2015-12-20 NOTE — Assessment & Plan Note (Signed)
On crestor.  Low cholesterol diet and exercise.  Follow lipid panel and liver function tests.   

## 2015-12-20 NOTE — Assessment & Plan Note (Signed)
Blood pressure under good control.  Continue same medication regimen.  Follow pressures.  Follow metabolic panel.   

## 2016-03-08 ENCOUNTER — Emergency Department: Payer: Managed Care, Other (non HMO)

## 2016-03-08 ENCOUNTER — Emergency Department
Admission: EM | Admit: 2016-03-08 | Discharge: 2016-03-08 | Disposition: A | Discharge status: Home / Self Care | Payer: Managed Care, Other (non HMO) | Attending: Emergency Medicine | Admitting: Emergency Medicine

## 2016-03-08 DIAGNOSIS — E119 Type 2 diabetes mellitus without complications: Secondary | ICD-10-CM | POA: Insufficient documentation

## 2016-03-08 DIAGNOSIS — R1013 Epigastric pain: Secondary | ICD-10-CM | POA: Diagnosis present

## 2016-03-08 DIAGNOSIS — Z791 Long term (current) use of non-steroidal anti-inflammatories (NSAID): Secondary | ICD-10-CM | POA: Diagnosis not present

## 2016-03-08 DIAGNOSIS — Z79899 Other long term (current) drug therapy: Secondary | ICD-10-CM | POA: Diagnosis not present

## 2016-03-08 DIAGNOSIS — M1990 Unspecified osteoarthritis, unspecified site: Secondary | ICD-10-CM | POA: Diagnosis not present

## 2016-03-08 DIAGNOSIS — Z7951 Long term (current) use of inhaled steroids: Secondary | ICD-10-CM | POA: Diagnosis not present

## 2016-03-08 DIAGNOSIS — J449 Chronic obstructive pulmonary disease, unspecified: Secondary | ICD-10-CM | POA: Insufficient documentation

## 2016-03-08 DIAGNOSIS — F329 Major depressive disorder, single episode, unspecified: Secondary | ICD-10-CM | POA: Diagnosis not present

## 2016-03-08 DIAGNOSIS — Z7984 Long term (current) use of oral hypoglycemic drugs: Secondary | ICD-10-CM | POA: Insufficient documentation

## 2016-03-08 DIAGNOSIS — I10 Essential (primary) hypertension: Secondary | ICD-10-CM | POA: Diagnosis not present

## 2016-03-08 DIAGNOSIS — J45909 Unspecified asthma, uncomplicated: Secondary | ICD-10-CM | POA: Diagnosis not present

## 2016-03-08 DIAGNOSIS — K8020 Calculus of gallbladder without cholecystitis without obstruction: Secondary | ICD-10-CM | POA: Diagnosis not present

## 2016-03-08 LAB — COMPREHENSIVE METABOLIC PANEL
ALK PHOS: 59 U/L (ref 38–126)
ALT: 11 U/L — AB (ref 14–54)
AST: 10 U/L — AB (ref 15–41)
Albumin: 3.5 g/dL (ref 3.5–5.0)
Anion gap: 7 (ref 5–15)
BUN: 25 mg/dL — AB (ref 6–20)
CALCIUM: 8.4 mg/dL — AB (ref 8.9–10.3)
CO2: 29 mmol/L (ref 22–32)
CREATININE: 0.84 mg/dL (ref 0.44–1.00)
Chloride: 103 mmol/L (ref 101–111)
Glucose, Bld: 128 mg/dL — ABNORMAL HIGH (ref 65–99)
Potassium: 3.5 mmol/L (ref 3.5–5.1)
SODIUM: 139 mmol/L (ref 135–145)
TOTAL PROTEIN: 6.4 g/dL — AB (ref 6.5–8.1)
Total Bilirubin: 0.6 mg/dL (ref 0.3–1.2)

## 2016-03-08 LAB — CBC
HCT: 35.4 % (ref 35.0–47.0)
HEMOGLOBIN: 12 g/dL (ref 12.0–16.0)
MCH: 26.9 pg (ref 26.0–34.0)
MCHC: 33.8 g/dL (ref 32.0–36.0)
MCV: 79.4 fL — AB (ref 80.0–100.0)
Platelets: 224 10*3/uL (ref 150–440)
RBC: 4.46 MIL/uL (ref 3.80–5.20)
RDW: 14.1 % (ref 11.5–14.5)
WBC: 7.9 10*3/uL (ref 3.6–11.0)

## 2016-03-08 LAB — URINALYSIS COMPLETE WITH MICROSCOPIC (ARMC ONLY)
BACTERIA UA: NONE SEEN
Bilirubin Urine: NEGATIVE
GLUCOSE, UA: NEGATIVE mg/dL
Hgb urine dipstick: NEGATIVE
KETONES UR: NEGATIVE mg/dL
NITRITE: NEGATIVE
Protein, ur: NEGATIVE mg/dL
SPECIFIC GRAVITY, URINE: 1.023 (ref 1.005–1.030)
pH: 5 (ref 5.0–8.0)

## 2016-03-08 LAB — TROPONIN I

## 2016-03-08 LAB — LIPASE, BLOOD: LIPASE: 29 U/L (ref 11–51)

## 2016-03-08 MED ORDER — ONDANSETRON HCL 4 MG/2ML IJ SOLN
4.0000 mg | Freq: Once | INTRAMUSCULAR | Status: AC
  Administered 2016-03-08: 4 mg via INTRAVENOUS
  Filled 2016-03-08: qty 2

## 2016-03-08 MED ORDER — ONDANSETRON 4 MG PO TBDP: 4.0000 mg | ORAL_TABLET | Freq: Three times a day (TID) | ORAL | Status: DC | PRN

## 2016-03-08 MED ORDER — GI COCKTAIL ~~LOC~~
30.0000 mL | Freq: Once | ORAL | Status: AC
  Administered 2016-03-08: 30 mL via ORAL
  Filled 2016-03-08: qty 30

## 2016-03-08 MED ORDER — OXYCODONE-ACETAMINOPHEN 5-325 MG PO TABS: 1.0000 | ORAL_TABLET | Freq: Four times a day (QID) | ORAL | Status: DC | PRN

## 2016-03-08 MED ORDER — DIATRIZOATE MEGLUMINE & SODIUM 66-10 % PO SOLN
15.0000 mL | Freq: Once | ORAL | Status: AC
  Administered 2016-03-08: 15 mL via ORAL

## 2016-03-08 MED ORDER — IOPAMIDOL (ISOVUE-370) INJECTION 76%
100.0000 mL | Freq: Once | INTRAVENOUS | Status: AC | PRN
  Administered 2016-03-08: 100 mL via INTRAVENOUS

## 2016-03-08 MED ORDER — MORPHINE SULFATE (PF) 4 MG/ML IV SOLN
4.0000 mg | Freq: Once | INTRAVENOUS | Status: AC
  Administered 2016-03-08: 4 mg via INTRAVENOUS
  Filled 2016-03-08: qty 1

## 2016-03-08 NOTE — Discharge Instructions (Signed)
Abdominal Pain, Adult °Many things can cause abdominal pain. Usually, abdominal pain is not caused by a disease and will improve without treatment. It can often be observed and treated at home. Your health care provider will do a physical exam and possibly order blood tests and X-rays to help determine the seriousness of your pain. However, in many cases, more time must pass before a clear cause of the pain can be found. Before that point, your health care provider may not know if you need more testing or further treatment. °HOME CARE INSTRUCTIONS °Monitor your abdominal pain for any changes. The following actions may help to alleviate any discomfort you are experiencing: °· Only take over-the-counter or prescription medicines as directed by your health care provider. °· Do not take laxatives unless directed to do so by your health care provider. °· Try a clear liquid diet (broth, tea, or water) as directed by your health care provider. Slowly move to a bland diet as tolerated. °SEEK MEDICAL CARE IF: °· You have unexplained abdominal pain. °· You have abdominal pain associated with nausea or diarrhea. °· You have pain when you urinate or have a bowel movement. °· You experience abdominal pain that wakes you in the night. °· You have abdominal pain that is worsened or improved by eating food. °· You have abdominal pain that is worsened with eating fatty foods. °· You have a fever. °SEEK IMMEDIATE MEDICAL CARE IF: °· Your pain does not go away within 2 hours. °· You keep throwing up (vomiting). °· Your pain is felt only in portions of the abdomen, such as the right side or the left lower portion of the abdomen. °· You pass bloody or black tarry stools. °MAKE SURE YOU: °· Understand these instructions. °· Will watch your condition. °· Will get help right away if you are not doing well or get worse. °  °This information is not intended to replace advice given to you by your health care provider. Make sure you discuss  any questions you have with your health care provider. °  °Document Released: 05/18/2005 Document Revised: 04/29/2015 Document Reviewed: 04/17/2013 °Elsevier Interactive Patient Education ©2016 [employer] ° °Cholelithiasis °Cholelithiasis (also called gallstones) is a form of gallbladder disease in which gallstones form in your gallbladder. The gallbladder is an organ that stores bile made in the liver, which helps digest fats. Gallstones begin as small crystals and slowly grow into stones. Gallstone pain occurs when the gallbladder spasms and a gallstone is blocking the duct. Pain can also occur when a stone passes out of the duct.  °RISK FACTORS °· Being female.   °· Having multiple pregnancies. Health care providers sometimes advise removing diseased gallbladders before future pregnancies.   °· Being obese. °· Eating a diet heavy in fried foods and fat.   °· Being older than 60 years and increasing age.   °· Prolonged use of medicines containing female hormones.   °· Having diabetes mellitus.   °· Rapidly losing weight.   °· Having a family history of gallstones (heredity).   °SYMPTOMS °· Nausea.   °· Vomiting. °· Abdominal pain.   °· Yellowing of the skin (jaundice).   °· Sudden pain. It may persist from several minutes to several hours. °· Fever.   °· Tenderness to the touch.  °In some cases, when gallstones do not move into the bile duct, people have no pain or symptoms. These are called "silent" gallstones.  °TREATMENT °Silent gallstones do not need treatment. In severe cases, emergency surgery may be required. Options for treatment include: °· Surgery to   remove the gallbladder. This is the most common treatment. °· Medicines. These do not always work and may take 6-12 months or more to work. °· Shock wave treatment (extracorporeal biliary lithotripsy). In this treatment an ultrasound machine sends shock waves to the gallbladder to break gallstones into smaller pieces that can pass into the intestines or  be dissolved by medicine. °HOME CARE INSTRUCTIONS  °· Only take over-the-counter or prescription medicines for pain, discomfort, or fever as directed by your health care provider.   °· Follow a low-fat diet until seen again by your health care provider. Fat causes the gallbladder to contract, which can result in pain.   °· Follow up with your health care provider as directed. Attacks are almost always recurrent and surgery is usually required for permanent treatment.   °SEEK IMMEDIATE MEDICAL CARE IF:  °· Your pain increases and is not controlled by medicines.   °· You have a fever or persistent symptoms for more than 2-3 days.   °· You have a fever and your symptoms suddenly get worse.   °· You have persistent nausea and vomiting.   °MAKE SURE YOU:  °· Understand these instructions. °· Will watch your condition. °· Will get help right away if you are not doing well or get worse. °  °This information is not intended to replace advice given to you by your health care provider. Make sure you discuss any questions you have with your health care provider. °  °Document Released: 08/04/2005 Document Revised: 04/10/2013 Document Reviewed: 01/30/2013 °Elsevier Interactive Patient Education ©2016 [employer] ° °

## 2016-03-08 NOTE — ED Notes (Signed)
Patient returned from CT

## 2016-03-08 NOTE — ED Notes (Signed)
Pt to triage via w/c with no distress noted; reports awoke with abd pain located to waistline, nonradiating accomp by nausea; denies hx of same

## 2016-03-08 NOTE — ED Notes (Signed)
Called CT to come get patient, she has completed her contrast.

## 2016-03-08 NOTE — ED Notes (Addendum)
Patient states she had gastric bypass in [id number] with no complications.  She compares it to the gas decompression pains she had post surgical.

## 2016-03-08 NOTE — ED Provider Notes (Signed)
[facility] Emergency Department Provider Note   ____________________________________________  Time seen: Approximately 517 AM  I have reviewed the triage vital signs and the nursing notes.   HISTORY  Chief Complaint Abdominal Pain    HPI Glenda Zavala is a 65 y.o. female who comes into the hospital today with abdominal pain. The patient reports that her pain woke her up out of her sleep around 2 AM. The patient reports that the pain is epigastric and radiates around her abdomen to her back. She reports that the pain is a 6 out of 10 in intensity. It does not feel like indigestion. She's never had this pain before. The only thing she could compare it to his when she had a lap band procedure and she had epigastric pain initially. The patient did not take any medicine for her pain. She's had some nausea with no vomiting and no diarrhea. The patient also denies any dizziness, lightheadedness, shortness of breath, chest pain. The patient describes her pain as sharp and constant. The patient decided to come into the hospital to get checked out.   Past Medical History  Diagnosis Date  . Hypertension   . Hypercholesterolemia   . Asthma   . Environmental allergies   . PSVT (paroxysmal supraventricular tachycardia) ([place])   . GERD (gastroesophageal reflux disease)   . Diabetes mellitus ([place])   . COPD (chronic obstructive pulmonary disease) ([place])   . Anemia   . Arthritis     Patient Active Problem List   Diagnosis Date Noted  . Dizziness 12/20/2015  . Sinusitis 08/21/2015  . Breast cancer screening 08/21/2015  . URI (upper respiratory infection) 04/19/2015  . PVC's (premature ventricular contractions) 04/19/2015  . Health care maintenance 12/15/2014  . Morbid obesity with BMI of 50.0-59.9, adult ([place]) 08/03/2014  . Skin lesion 08/03/2014  . Knee pain 03/23/2014  . Obesity 03/23/2014  . Hypoxia 05/26/2013  . Hx of laparoscopic gastric banding  01/20/2013  . Asthma 07/15/2012  . Anemia 07/15/2012  . Hypertension 07/15/2012  . Depression 07/15/2012  . Arthritis 07/15/2012  . Diabetes mellitus ([place]) 07/15/2012  . Hypercholesterolemia 07/15/2012  . GERD (gastroesophageal reflux disease) 07/15/2012    Past Surgical History  Procedure Laterality Date  . Cesarean section    . Tonsillectomy    . Tubal ligation    . Abdominal hysterectomy  1990    secondary to bleeding and hyperplasia  . Laparoscopic gastric banding  2010    Current Outpatient Rx  Name  Route  Sig  Dispense  Refill  . acetaminophen (TYLENOL) 650 MG CR tablet   Oral   Take 650 mg by mouth every 8 (eight) hours as needed.         Marland Kitchen albuterol (PROVENTIL HFA;VENTOLIN HFA) 108 (90 Base) MCG/ACT inhaler   Inhalation   Inhale 2 puffs into the lungs every 6 (six) hours as needed for wheezing or shortness of breath.   1 Inhaler   2   . albuterol (PROVENTIL) (2.5 MG/3ML) 0.083% nebulizer solution   Nebulization   Take 3 mLs (2.5 mg total) by nebulization every 6 (six) hours as needed for shortness of breath.   150 mL   1   . citalopram (CELEXA) 40 MG tablet      TAKE ONE (1) TABLET BY MOUTH EVERY DAY   30 tablet   11   . cyanocobalamin (,VITAMIN B-12,) 1000 MCG/ML injection   Intramuscular   Inject 1,000 mcg into the muscle every  30 (thirty) days.         . fluticasone (FLONASE) 50 MCG/ACT nasal spray      USE 2 SPRAYS EACH NOSTRIL ONE TIME DAILY   16 g   3   . hydrochlorothiazide (HYDRODIURIL) 25 MG tablet      TAKE ONE (1) TABLET EACH DAY   90 tablet   3   . lisinopril (PRINIVIL,ZESTRIL) 40 MG tablet      TAKE ONE TABLET BY MOUTH EVERY MORNING FOR HIGH BLOOD PRESSURE   90 tablet   3   . metFORMIN (GLUCOPHAGE) 1000 MG tablet      TAKE ONE (1) TABLET BY MOUTH TWO (2) TIMES DAILY   60 tablet   5   . montelukast (SINGULAIR) 10 MG tablet   Oral   Take 10 mg by mouth at bedtime as needed.          . nystatin (MYCOSTATIN) powder       APPLY TO AFFECTED AREAS TWICE DAILY   60 g   1   . nystatin cream (MYCOSTATIN)      APPLY TO AFFECTED AREAS TOPICALLY TWICE A DAY   30 g   1   . rosuvastatin (CRESTOR) 5 MG tablet      TAKE ONE TABLET BY MOUTH EVERY NIGHT AT BEDTIME   90 tablet   3   . tiotropium (SPIRIVA HANDIHALER) 18 MCG inhalation capsule      INHALE CONTENTS OF ONE CAPSULE ONCE A DAY AS DIRECTED   30 capsule   5   . verapamil (CALAN-SR) 240 MG CR tablet      TAKE ONE (1) TABLET EACH DAY   90 tablet   1   . Blood Glucose Monitoring Suppl (ONETOUCH VERIO IQ SYSTEM) W/DEVICE KIT      AS DIRECTED   1 kit   0   . Fe Fum-FePoly-Vit C-Vit B3 (INTEGRA) 62.5-62.5-40-3 MG CAPS      TAKE ONE CAPSULE BY MOUTH DAILY Patient not taking: Reported on 03/08/2016   30 each   5   . fluconazole (DIFLUCAN) 150 MG tablet   Oral   Take 1 tablet (150 mg total) by mouth once.   1 tablet   0   . glucose blood (ONETOUCH VERIO) test strip      TEST BLOOD GLUCOSE TWICE DAILY   50 each   5   . Lancets (ACCU-CHEK SOFT TOUCH) lancets      Use as instructed   100 each   1   . ondansetron (ZOFRAN ODT) 4 MG disintegrating tablet   Oral   Take 1 tablet (4 mg total) by mouth every 8 (eight) hours as needed for nausea or vomiting.   20 tablet   0   . oxyCODONE-acetaminophen (ROXICET) 5-325 MG tablet   Oral   Take 1 tablet by mouth every 6 (six) hours as needed.   12 tablet   0     Allergies Review of patient's allergies indicates no known allergies.  Family History  Problem Relation Age of Onset  . Hypercholesterolemia Mother   . Emphysema Maternal Grandfather   . Uterine cancer      aunt  . Breast cancer Neg Hx   . Colon cancer Neg Hx     Social History Social History  Substance Use Topics  . Smoking status: Never Smoker   . Smokeless tobacco: Never Used  . Alcohol Use: No    Review of Systems Constitutional: No fever/chills Eyes:  No visual changes. ENT: No sore  throat. Cardiovascular: Denies chest pain. Respiratory: Denies shortness of breath. Gastrointestinal:  abdominal pain.   nausea, no vomiting.  No diarrhea.  No constipation. Genitourinary: Negative for dysuria. Musculoskeletal: Negative for back pain. Skin: Negative for rash. Neurological: Negative for headaches, focal weakness or numbness.  10-point ROS otherwise negative.  ____________________________________________   PHYSICAL EXAM:  VITAL SIGNS: ED Triage Vitals  Enc Vitals Group     BP 03/08/16 0354 148/76 mmHg     Pulse Rate 03/08/16 0354 71     Resp --      Temp 03/08/16 0354 98 F (36.7 C)     Temp Source 03/08/16 0354 Oral     SpO2 03/08/16 0354 100 %     Weight 03/08/16 0354 300 lb (136.079 kg)     Height 03/08/16 0354 '5\' 3"'  (1.6 m)     Head Cir --      Peak Flow --      Pain Score 03/08/16 0344 6     Pain Loc --      Pain Edu? --      Excl. in [place]? --     Constitutional: Alert and oriented. Well appearing and in moderate distress. Eyes: Conjunctivae are normal. PERRL. EOMI. Head: Atraumatic. Nose: No congestion/rhinnorhea. Mouth/Throat: Mucous membranes are moist.  Oropharynx non-erythematous. Cardiovascular: Normal rate, regular rhythm. Grossly normal heart sounds.  Good peripheral circulation. Respiratory: Normal respiratory effort.  No retractions. Lungs CTAB. Gastrointestinal: Soft with upper abdominal pain to palpation. No distention. Positive bowel sounds Musculoskeletal: No lower extremity tenderness nor edema.   Neurologic:  Normal speech and language.  Skin:  Skin is warm, dry and intact.  Psychiatric: Mood and affect are normal.   ____________________________________________   LABS (all labs ordered are listed, but only abnormal results are displayed)  Labs Reviewed  CBC - Abnormal; Notable for the following:    MCV 79.4 (*)    All other components within normal limits  COMPREHENSIVE METABOLIC PANEL - Abnormal; Notable for the following:     Glucose, Bld 128 (*)    BUN 25 (*)    Calcium 8.4 (*)    Total Protein 6.4 (*)    AST 10 (*)    ALT 11 (*)    All other components within normal limits  URINALYSIS COMPLETEWITH MICROSCOPIC ([facility] ONLY) - Abnormal; Notable for the following:    Color, Urine YELLOW (*)    APPearance CLEAR (*)    Leukocytes, UA TRACE (*)    Squamous Epithelial / LPF 0-5 (*)    All other components within normal limits  LIPASE, BLOOD  TROPONIN I   ____________________________________________  EKG  ED ECG REPORT I, Loney Hering, the attending physician, personally viewed and interpreted this ECG.   Date: 03/08/2016  EKG Time: 0450   Rate: 63  Rhythm: normal sinus rhythm  Axis: normal  Intervals:none  ST&T Change: none  ____________________________________________  RADIOLOGY  CT abdomen and pelvis: No acute finding, LAP-BAND without complicating feature, cholelithiasis, colonic diverticulosis. ____________________________________________   PROCEDURES  Procedure(s) performed: None  Procedures  Critical Care performed: No  ____________________________________________   INITIAL IMPRESSION / ASSESSMENT AND PLAN / ED COURSE  Pertinent labs & imaging results that were available during my care of the patient were reviewed by me and considered in my medical decision making (see chart for details).  This is a 65 year old female who comes into the hospital today with abdominal pain. The patient does have  a history of LAP-BAND surgery. I will do some blood work and a CT scan to evaluate the patient. I will give her dose of morphine and Zofran. I also give the patient a GI cocktail. The patient's pain is much improved after the medication. It appears that the patient does have some cholelithiasis on a blood work is unremarkable. I informed her that she probably does need to have her gallbladder taken out as she might have continued pain. She reports that she had had some fried fish a  few days ago. Otherwise the patient has no further complaints or concerns. She'll be discharged home to follow-up with surgery. ____________________________________________   FINAL CLINICAL IMPRESSION(S) / ED DIAGNOSES  Final diagnoses:  Gallstones  Epigastric pain      NEW MEDICATIONS STARTED DURING THIS VISIT:  New Prescriptions   ONDANSETRON (ZOFRAN ODT) 4 MG DISINTEGRATING TABLET    Take 1 tablet (4 mg total) by mouth every 8 (eight) hours as needed for nausea or vomiting.   OXYCODONE-ACETAMINOPHEN (ROXICET) 5-325 MG TABLET    Take 1 tablet by mouth every 6 (six) hours as needed.     Note:  This document was prepared using Dragon voice recognition software and may include unintentional dictation errors.    Loney Hering, MD 03/08/16 0700

## 2016-03-14 ENCOUNTER — Ambulatory Visit (INDEPENDENT_AMBULATORY_CARE_PROVIDER_SITE_OTHER): Payer: Managed Care, Other (non HMO) | Admitting: Surgery

## 2016-03-14 ENCOUNTER — Ambulatory Visit: Payer: Self-pay | Admitting: Surgery

## 2016-03-14 DIAGNOSIS — K8020 Calculus of gallbladder without cholecystitis without obstruction: Secondary | ICD-10-CM

## 2016-03-14 NOTE — Consult Note (Signed)
Surgical Consultation  03/14/2016  Glenda Zavala is an 65 y.o. female.   CC: Biliary colic  HPI: This a patient referred from the emergency room with classic signs of biliary colic. She has recurrent and episodic right upper quadrant pain associated fatty food intolerance and workup showing gallstones. She's had some nausea and dry heaves but her pain is completely gone at this point the last episode lasted 6 hours. She denies fevers or chills denies jaundice or acholic stools.  Of note she has had a lap band procedure in 2008 in which she lost 150 pounds and was put 70 pounds back on because she was having reflux the saline was removed from the band  She has also had C-sections and hysterectomy.  Past Medical History:  Diagnosis Date  . Anemia   . Arthritis   . Asthma   . COPD (chronic obstructive pulmonary disease) ([place])   . Diabetes mellitus ([place])   . Environmental allergies   . GERD (gastroesophageal reflux disease)   . Hypercholesterolemia   . Hypertension   . PSVT (paroxysmal supraventricular tachycardia) ([place])     Past Surgical History:  Procedure Laterality Date  . ABDOMINAL HYSTERECTOMY  1990   secondary to bleeding and hyperplasia  . CESAREAN SECTION    . LAPAROSCOPIC GASTRIC BANDING  2010  . TONSILLECTOMY    . TUBAL LIGATION      Family History  Problem Relation Age of Onset  . Hypercholesterolemia Mother   . Emphysema Maternal Grandfather   . Uterine cancer      aunt  . Breast cancer Neg Hx   . Colon cancer Neg Hx     Social History:  reports that she has never smoked. She has never used smokeless tobacco. She reports that she does not drink alcohol or use drugs.  Allergies: No Known Allergies  Medications reviewed.   Review of Systems:   Review of Systems  Constitutional: Negative for chills, fever and weight loss.  HENT: Negative.   Eyes: Negative.   Respiratory: Negative.   Cardiovascular: Negative.   Gastrointestinal: Positive for  abdominal pain, nausea and vomiting. Negative for blood in stool, constipation, diarrhea and heartburn.  Genitourinary: Negative.   Musculoskeletal: Negative.   Skin: Negative.   Neurological: Negative.   Endo/Heme/Allergies: Negative.   Psychiatric/Behavioral: Negative.      Physical Exam:  BP 134/67 (BP Location: Left Arm, Patient Position: Sitting, Cuff Size: Large)   Pulse (!) 59   Temp 98.8 F (37.1 C) (Oral)   Ht 5\' 3"  (1.6 m)   Wt (!) 305 lb (138.3 kg)   BMI 54.03 kg/m   Physical Exam  Constitutional: She is well-developed, well-nourished, and in no distress. No distress.  BMI calculated at 54 with a weight of 305  HENT:  Head: Normocephalic and atraumatic.  Eyes: Pupils are equal, round, and reactive to light. Right eye exhibits no discharge. Left eye exhibits no discharge. No scleral icterus.  Neck: Normal range of motion.  Cardiovascular: Normal rate, regular rhythm and normal heart sounds.   Pulmonary/Chest: Effort normal and breath sounds normal. No respiratory distress. She has no wheezes. She has no rales.  Abdominal: Soft. She exhibits no distension. There is no tenderness. There is no rebound and no guarding.  3 cm mass below a port site in the right upper quadrant nonreducible and nontender this represents a lap band port Nontender abdomen Lower midline scar  Musculoskeletal: Normal range of motion. She exhibits edema.  "Scratch" wounds in  the left leg with Band-Aids  Lymphadenopathy:    She has no cervical adenopathy.  Skin: Skin is warm and dry. No rash noted. She is not diaphoretic. There is erythema. No pallor.  Psychiatric: Mood and affect normal.  Vitals reviewed.     No results found for this or any previous visit (from the past 48 hour(s)). No results found.  Assessment/Plan:  Morbidly obese female patient with classic signs of biliary colic. The patient desires surgical intervention at this point. I discussed with her her morbid obesity and  diabetes and the risks of untreated gallbladder disease as well as surgical risks associated with morbid obesity including bleeding infection bowel injury bile duct injury and DVT or pulmonary embolus. I discussed prophylaxis as well questions were answered for her she understood and agreed to proceed  Florene Glen, MD, FACS

## 2016-03-14 NOTE — Patient Instructions (Signed)
Please call our office if you have questions or concerns. Please see [place] pre-care sheet provided.

## 2016-03-16 ENCOUNTER — Telehealth: Payer: Self-pay | Admitting: Surgery

## 2016-03-16 NOTE — Telephone Encounter (Signed)
Pt advised of pre op date/time and sx date. Sx: 03/30/16 with Dr Maeola Sarah Cholecystectomy.  Pre op: 03/21/16 @ 9:00am--Office.   Patient made aware to call [phone], between 1-3:00pm the day before surgery, to find out what time to arrive.

## 2016-03-21 ENCOUNTER — Encounter
Admission: RE | Admit: 2016-03-21 | Discharge: 2016-03-21 | Disposition: A | Discharge status: Home / Self Care | Payer: Managed Care, Other (non HMO) | Source: Ambulatory Visit | Attending: Surgery | Admitting: Surgery

## 2016-03-21 ENCOUNTER — Encounter: Payer: Self-pay | Admitting: *Deleted

## 2016-03-21 DIAGNOSIS — Z01812 Encounter for preprocedural laboratory examination: Secondary | ICD-10-CM | POA: Insufficient documentation

## 2016-03-21 HISTORY — DX: Depression, unspecified: F32.A

## 2016-03-21 HISTORY — DX: Cardiac murmur, unspecified: R01.1

## 2016-03-21 HISTORY — DX: Major depressive disorder, single episode, unspecified: F32.9

## 2016-03-21 LAB — CBC WITH DIFFERENTIAL/PLATELET
BASOS ABS: 0 10*3/uL (ref 0–0.1)
BASOS PCT: 1 %
EOS ABS: 0.2 10*3/uL (ref 0–0.7)
Eosinophils Relative: 3 %
HCT: 36.4 % (ref 35.0–47.0)
HEMOGLOBIN: 12.2 g/dL (ref 12.0–16.0)
Lymphocytes Relative: 21 %
Lymphs Abs: 1.3 10*3/uL (ref 1.0–3.6)
MCH: 26.7 pg (ref 26.0–34.0)
MCHC: 33.6 g/dL (ref 32.0–36.0)
MCV: 79.5 fL — ABNORMAL LOW (ref 80.0–100.0)
Monocytes Absolute: 0.4 10*3/uL (ref 0.2–0.9)
Monocytes Relative: 6 %
NEUTROS PCT: 69 %
Neutro Abs: 4.5 10*3/uL (ref 1.4–6.5)
Platelets: 202 10*3/uL (ref 150–440)
RBC: 4.58 MIL/uL (ref 3.80–5.20)
RDW: 14.5 % (ref 11.5–14.5)
WBC: 6.4 10*3/uL (ref 3.6–11.0)

## 2016-03-21 LAB — COMPREHENSIVE METABOLIC PANEL
ALBUMIN: 3.6 g/dL (ref 3.5–5.0)
ALK PHOS: 65 U/L (ref 38–126)
ALT: 11 U/L — AB (ref 14–54)
AST: 9 U/L — ABNORMAL LOW (ref 15–41)
Anion gap: 8 (ref 5–15)
BUN: 21 mg/dL — ABNORMAL HIGH (ref 6–20)
CALCIUM: 8.8 mg/dL — AB (ref 8.9–10.3)
CO2: 30 mmol/L (ref 22–32)
CREATININE: 0.71 mg/dL (ref 0.44–1.00)
Chloride: 101 mmol/L (ref 101–111)
GFR calc Af Amer: >60 mL/min (ref >60)
GFR calc non Af Amer: >60 mL/min (ref >60)
GLUCOSE: 155 mg/dL — AB (ref 65–99)
Potassium: 3.2 mmol/L — ABNORMAL LOW (ref 3.5–5.1)
SODIUM: 139 mmol/L (ref 135–145)
Total Bilirubin: 0.5 mg/dL (ref 0.3–1.2)
Total Protein: 6.7 g/dL (ref 6.5–8.1)

## 2016-03-21 NOTE — Pre-Procedure Instructions (Signed)
KT 3.2 CALLED AND FAXED TO [facility] AT DR [facility]. RECHECK AM SURGERY NOTED

## 2016-03-21 NOTE — Pre-Procedure Instructions (Signed)
Anesthesia- ED MD interpretation of EKG.   ED ECG REPORT I, Loney Hering, the attending physician, personally viewed and interpreted this ECG.   Date: 03/08/2016  EKG Time: 0450                    Rate: 63  Rhythm: normal sinus rhythm  Axis: normal  Intervals:none  ST&T Change: none

## 2016-03-21 NOTE — Patient Instructions (Signed)
Your procedure is scheduled on:03-30-2016  Report to  [place]   To find out your arrival time please call [phone] between 1PM - 3PM on AUGUST 03-29-2016   Remember: Instructions that are not followed completely may result in serious medical risk, up to and including death, or upon the discretion of your surgeon and anesthesiologist your surgery may need to be rescheduled.  Recuerde: Las instrucciones que no se siguen completamente Heritage manager en un riesgo de salud grave, incluyendo hasta la [place] o a discrecin de su cirujano y Environmental health practitioner, su ciruga se puede posponer.   ___x_ 1. Do not eat food or drink liquids after midnight. No gum chewing or hard candies.  No coma alimentos ni tome lquidos despus de la [place].  No mastique chicle ni caramelos  duros.     __X__ 2. No alcohol for 24 hours before or after surgery.    No tome alcohol durante las 24 horas antes ni despus de la [place].   ____ 3. Bring all medications with you on the day of surgery if instructed.    Lleve todos los medicamentos con usted el da de su ciruga si se le ha indicado as.   __X__ 4. Notify your doctor if there is any change in your medical condition (cold, fever,                             infections).    Informe a su mdico si hay algn cambio en su condicin mdica (resfriado, fiebre, infecciones).   Do not wear jewelry, make-up, hairpins, clips or nail polish.  No use joyas, maquillajes, pinzas/ganchos para el cabello ni esmalte de uas.  Do not wear lotions, powders, or perfumes. You may wear deodorant.  No use lociones, polvos o perfumes.  Puede usar desodorante.    Do not shave 48 hours prior to surgery. Men may shave face and neck.  No se afeite 48 horas antes de la [place].  Los hombres pueden [employer] cara y el cuello.   Do not bring valuables to the hospital.   No lleve objetos [facility] is not responsible for any belongings or  valuables.  [place] no se hace responsable de ningn tipo de pertenencias u objetos de Geographical information systems officer.               Contacts, dentures or bridgework may not be worn into surgery.  Los lentes de [place], las dentaduras postizas o puentes no se pueden usar en la [place].  Leave your suitcase in the car. After surgery it may be brought to your room.  Deje su maleta en el auto.  Despus de la ciruga podr traerla a su habitacin.  For patients admitted to the hospital, discharge time is determined by your treatment team.  Para los pacientes que sean ingresados al hospital, el tiempo en el cual se le dar de alta es determinado por su                equipo de [place].   Patients discharged the day of surgery will not be allowed to drive home. A los pacientes que se les da de alta el mismo da de la ciruga no se les permitir conducir a Holiday representative.   Please read over the following fact sheets that you were given: Por favor [place] informacin que le dieron:   Surgical Site Infection Prevention  ____ Take these medicines the morning of surgery with A SIP OF WATER:          Tome estas medicinas la maana de la ciruga con UN SORBO DE AGUA:  1.CELEXA  2. FLONASE  3. LISINOPRIL  4.VERAPAMIL     5.  6.  ____ Fleet Enema (as directed)          Enema de Fleet (segn lo indicado)    _X__ Use CHG Soap as directed          Utilice el jabn de CHG segn lo indicado  __X__ Use inhalers on the day of surgery          Use los inhaladores el da de la ciruga  ___X_ Stop metformin 2 days prior to surgery          Deje de tomar el metformin 2 das antes de la ciruga    ____ Take 1/2 of usual insulin dose the night before surgery and none on the morning of surgery           Tome la mitad de la dosis habitual de insulina la noche antes de la [place] y no tome nada en la maana de la             ciruga  ____ Stop Coumadin/Plavix/aspirin on           Deje de tomar el  Coumadin/Plavix/aspirina el da:  __X__ Stop Anti-inflammatories on Mar 23, 2016 USE ONLY TYLENOL          Deje de tomar antiinflamatorios el da:   __X__ Stop supplements until after surgery Mar 23, 2016          Deje de tomar suplementos hasta despus de la ciruga  ____ Bring C-Pap to the hospital          [place] al hospital

## 2016-03-22 ENCOUNTER — Telehealth: Payer: Self-pay

## 2016-03-22 MED ORDER — POTASSIUM CHLORIDE 20 MEQ PO PACK: 20.0000 meq | PACK | Freq: Two times a day (BID) | ORAL | 0 refills | Status: DC

## 2016-03-22 NOTE — Telephone Encounter (Signed)
Received a fax from [facility] stating that patient had a low potassium of 3.2. After reviewing her lab result, I ordered patient Potassium Chloride 20 MEQ 1 tab twice daily for 5 days.  Patient was called and informed about her potassium being low. Patient was instructed to start taking her potassium so she would be able to have surgery done on 03/30/2016.

## 2016-03-30 ENCOUNTER — Encounter: Payer: Self-pay | Admitting: Anesthesiology

## 2016-03-30 ENCOUNTER — Ambulatory Visit: Payer: Managed Care, Other (non HMO) | Admitting: Anesthesiology

## 2016-03-30 ENCOUNTER — Encounter
Admission: RE | Disposition: A | Discharge status: Home / Self Care | Payer: Self-pay | Source: Ambulatory Visit | Attending: Surgery

## 2016-03-30 ENCOUNTER — Observation Stay
Admission: RE | Admit: 2016-03-30 | Discharge: 2016-03-31 | Disposition: A | Discharge status: Home / Self Care | Payer: Managed Care, Other (non HMO) | Source: Ambulatory Visit | Attending: Surgery | Admitting: Surgery

## 2016-03-30 DIAGNOSIS — D649 Anemia, unspecified: Secondary | ICD-10-CM | POA: Diagnosis not present

## 2016-03-30 DIAGNOSIS — Z8249 Family history of ischemic heart disease and other diseases of the circulatory system: Secondary | ICD-10-CM | POA: Diagnosis not present

## 2016-03-30 DIAGNOSIS — Z9884 Bariatric surgery status: Secondary | ICD-10-CM | POA: Insufficient documentation

## 2016-03-30 DIAGNOSIS — Z825 Family history of asthma and other chronic lower respiratory diseases: Secondary | ICD-10-CM | POA: Diagnosis not present

## 2016-03-30 DIAGNOSIS — K8050 Calculus of bile duct without cholangitis or cholecystitis without obstruction: Secondary | ICD-10-CM

## 2016-03-30 DIAGNOSIS — Z90710 Acquired absence of both cervix and uterus: Secondary | ICD-10-CM | POA: Diagnosis not present

## 2016-03-30 DIAGNOSIS — J449 Chronic obstructive pulmonary disease, unspecified: Secondary | ICD-10-CM | POA: Diagnosis not present

## 2016-03-30 DIAGNOSIS — Z8049 Family history of malignant neoplasm of other genital organs: Secondary | ICD-10-CM | POA: Insufficient documentation

## 2016-03-30 DIAGNOSIS — M1990 Unspecified osteoarthritis, unspecified site: Secondary | ICD-10-CM | POA: Insufficient documentation

## 2016-03-30 DIAGNOSIS — K828 Other specified diseases of gallbladder: Secondary | ICD-10-CM | POA: Insufficient documentation

## 2016-03-30 DIAGNOSIS — K8010 Calculus of gallbladder with chronic cholecystitis without obstruction: Secondary | ICD-10-CM | POA: Diagnosis not present

## 2016-03-30 DIAGNOSIS — K219 Gastro-esophageal reflux disease without esophagitis: Secondary | ICD-10-CM | POA: Diagnosis not present

## 2016-03-30 DIAGNOSIS — J45909 Unspecified asthma, uncomplicated: Secondary | ICD-10-CM | POA: Diagnosis not present

## 2016-03-30 DIAGNOSIS — Z6841 Body Mass Index (BMI) 40.0 and over, adult: Secondary | ICD-10-CM | POA: Insufficient documentation

## 2016-03-30 DIAGNOSIS — E119 Type 2 diabetes mellitus without complications: Secondary | ICD-10-CM | POA: Diagnosis not present

## 2016-03-30 DIAGNOSIS — E7800 Pure hypercholesterolemia, unspecified: Secondary | ICD-10-CM | POA: Insufficient documentation

## 2016-03-30 DIAGNOSIS — I10 Essential (primary) hypertension: Secondary | ICD-10-CM | POA: Diagnosis not present

## 2016-03-30 DIAGNOSIS — K8064 Calculus of gallbladder and bile duct with chronic cholecystitis without obstruction: Secondary | ICD-10-CM | POA: Diagnosis present

## 2016-03-30 DIAGNOSIS — I472 Ventricular tachycardia: Secondary | ICD-10-CM | POA: Diagnosis not present

## 2016-03-30 HISTORY — PX: CHOLECYSTECTOMY: SHX55

## 2016-03-30 LAB — GLUCOSE, CAPILLARY
Glucose-Capillary: 97 mg/dL (ref 65–99)
Glucose-Capillary: 126 mg/dL — ABNORMAL HIGH (ref 65–99)
Glucose-Capillary: 181 mg/dL — ABNORMAL HIGH (ref 65–99)

## 2016-03-30 LAB — CREATININE, SERUM
Creatinine, Ser: 0.74 mg/dL (ref 0.44–1.00)
GFR calc non Af Amer: >60 mL/min (ref >60)

## 2016-03-30 LAB — POCT I-STAT 4, (NA,K, GLUC, HGB,HCT)
GLUCOSE: 103 mg/dL — AB (ref 65–99)
HEMATOCRIT: 36 % (ref 36.0–46.0)
Hemoglobin: 12.2 g/dL (ref 12.0–15.0)
POTASSIUM: 4 mmol/L (ref 3.5–5.1)
Sodium: 140 mmol/L (ref 135–145)

## 2016-03-30 LAB — CBC
HEMATOCRIT: 41.1 % (ref 35.0–47.0)
Hemoglobin: 13.2 g/dL (ref 12.0–16.0)
MCH: 26.4 pg (ref 26.0–34.0)
MCHC: 32.2 g/dL (ref 32.0–36.0)
MCV: 82 fL (ref 80.0–100.0)
Platelets: 151 10*3/uL (ref 150–440)
RBC: 5.01 MIL/uL (ref 3.80–5.20)
RDW: 14.6 % — AB (ref 11.5–14.5)
WBC: 11.5 10*3/uL — AB (ref 3.6–11.0)

## 2016-03-30 SURGERY — LAPAROSCOPIC CHOLECYSTECTOMY
Anesthesia: General | Wound class: Clean Contaminated

## 2016-03-30 MED ORDER — LIDOCAINE HCL (CARDIAC) 20 MG/ML IV SOLN
INTRAVENOUS | Status: DC | PRN
  Administered 2016-03-30: 100 mg via INTRAVENOUS

## 2016-03-30 MED ORDER — FLUTICASONE PROPIONATE 50 MCG/ACT NA SUSP
1.0000 | Freq: Every day | NASAL | Status: DC
  Administered 2016-03-31: 1 via NASAL
  Filled 2016-03-30: qty 16

## 2016-03-30 MED ORDER — CHLORHEXIDINE GLUCONATE CLOTH 2 % EX PADS
6.0000 | MEDICATED_PAD | Freq: Once | CUTANEOUS | Status: AC
  Administered 2016-03-30: 6 via TOPICAL

## 2016-03-30 MED ORDER — HEPARIN SODIUM (PORCINE) 5000 UNIT/ML IJ SOLN
INTRAMUSCULAR | Status: AC
  Administered 2016-03-30: 5000 [IU] via SUBCUTANEOUS
  Filled 2016-03-30: qty 1

## 2016-03-30 MED ORDER — SUCCINYLCHOLINE CHLORIDE 20 MG/ML IJ SOLN
INTRAMUSCULAR | Status: DC | PRN
  Administered 2016-03-30: 120 mg via INTRAVENOUS

## 2016-03-30 MED ORDER — METFORMIN HCL 500 MG PO TABS
500.0000 mg | ORAL_TABLET | Freq: Every day | ORAL | Status: DC
  Administered 2016-03-31: 500 mg via ORAL
  Filled 2016-03-30: qty 1

## 2016-03-30 MED ORDER — MONTELUKAST SODIUM 10 MG PO TABS: 10.0000 mg | ORAL_TABLET | Freq: Every evening | ORAL | Status: DC | PRN

## 2016-03-30 MED ORDER — FAMOTIDINE 20 MG PO TABS
ORAL_TABLET | ORAL | Status: AC
  Administered 2016-03-30: 20 mg via ORAL
  Filled 2016-03-30: qty 1

## 2016-03-30 MED ORDER — BUPIVACAINE-EPINEPHRINE (PF) 0.25% -1:200000 IJ SOLN
INTRAMUSCULAR | Status: AC
  Filled 2016-03-30: qty 30

## 2016-03-30 MED ORDER — ALBUTEROL SULFATE (2.5 MG/3ML) 0.083% IN NEBU: 2.5000 mg | INHALATION_SOLUTION | Freq: Four times a day (QID) | RESPIRATORY_TRACT | Status: DC | PRN

## 2016-03-30 MED ORDER — OXYCODONE HCL 5 MG PO TABS: 5.0000 mg | ORAL_TABLET | Freq: Once | ORAL | Status: DC | PRN

## 2016-03-30 MED ORDER — ALBUTEROL SULFATE HFA 108 (90 BASE) MCG/ACT IN AERS: 2.0000 | INHALATION_SPRAY | Freq: Four times a day (QID) | RESPIRATORY_TRACT | Status: DC | PRN

## 2016-03-30 MED ORDER — ONDANSETRON HCL 4 MG/2ML IJ SOLN
INTRAMUSCULAR | Status: DC | PRN
  Administered 2016-03-30: 4 mg via INTRAVENOUS

## 2016-03-30 MED ORDER — LISINOPRIL 5 MG PO TABS
5.0000 mg | ORAL_TABLET | Freq: Every day | ORAL | Status: DC
  Administered 2016-03-31: 5 mg via ORAL
  Filled 2016-03-30 (×2): qty 1

## 2016-03-30 MED ORDER — HYDROCHLOROTHIAZIDE 25 MG PO TABS
25.0000 mg | ORAL_TABLET | Freq: Every day | ORAL | Status: DC
  Administered 2016-03-31: 25 mg via ORAL
  Filled 2016-03-30 (×2): qty 1

## 2016-03-30 MED ORDER — ONDANSETRON HCL 4 MG/2ML IJ SOLN: 4.0000 mg | Freq: Four times a day (QID) | INTRAMUSCULAR | Status: DC | PRN

## 2016-03-30 MED ORDER — FENTANYL CITRATE (PF) 100 MCG/2ML IJ SOLN
INTRAMUSCULAR | Status: AC
  Administered 2016-03-30: 50 ug via INTRAVENOUS
  Filled 2016-03-30: qty 2

## 2016-03-30 MED ORDER — OXYCODONE HCL 5 MG/5ML PO SOLN: 5.0000 mg | Freq: Once | ORAL | Status: DC | PRN

## 2016-03-30 MED ORDER — FAMOTIDINE 20 MG PO TABS
20.0000 mg | ORAL_TABLET | Freq: Once | ORAL | Status: AC
  Administered 2016-03-30: 20 mg via ORAL

## 2016-03-30 MED ORDER — CITALOPRAM HYDROBROMIDE 10 MG PO TABS
10.0000 mg | ORAL_TABLET | Freq: Every day | ORAL | Status: DC
  Administered 2016-03-31: 10 mg via ORAL
  Filled 2016-03-30 (×2): qty 1

## 2016-03-30 MED ORDER — LACTATED RINGERS IV SOLN
INTRAVENOUS | Status: DC | PRN
  Administered 2016-03-30: 10:00:00 via INTRAVENOUS

## 2016-03-30 MED ORDER — DEXAMETHASONE SODIUM PHOSPHATE 10 MG/ML IJ SOLN
INTRAMUSCULAR | Status: DC | PRN
  Administered 2016-03-30: 10 mg via INTRAVENOUS

## 2016-03-30 MED ORDER — FENTANYL CITRATE (PF) 100 MCG/2ML IJ SOLN
25.0000 ug | INTRAMUSCULAR | Status: DC | PRN
  Administered 2016-03-30 (×2): 50 ug via INTRAVENOUS

## 2016-03-30 MED ORDER — DEXTROSE 5 % IV SOLN
3.0000 g | INTRAVENOUS | Status: AC
  Administered 2016-03-30: 3 g via INTRAVENOUS
  Filled 2016-03-30: qty 3000

## 2016-03-30 MED ORDER — SUGAMMADEX SODIUM 200 MG/2ML IV SOLN: INTRAVENOUS | Status: DC | PRN

## 2016-03-30 MED ORDER — PROPOFOL 10 MG/ML IV BOLUS
INTRAVENOUS | Status: DC | PRN
  Administered 2016-03-30: 160 mg via INTRAVENOUS

## 2016-03-30 MED ORDER — ONDANSETRON HCL 4 MG PO TABS
4.0000 mg | ORAL_TABLET | Freq: Four times a day (QID) | ORAL | Status: DC | PRN
Start: 2016-03-30 — End: 2016-03-31

## 2016-03-30 MED ORDER — TIOTROPIUM BROMIDE MONOHYDRATE 18 MCG IN CAPS
18.0000 ug | ORAL_CAPSULE | Freq: Every day | RESPIRATORY_TRACT | Status: DC
  Administered 2016-03-31: 18 ug via RESPIRATORY_TRACT
  Filled 2016-03-30: qty 5

## 2016-03-30 MED ORDER — CHLORHEXIDINE GLUCONATE CLOTH 2 % EX PADS: 6.0000 | MEDICATED_PAD | Freq: Once | CUTANEOUS | Status: DC

## 2016-03-30 MED ORDER — ACETAMINOPHEN 10 MG/ML IV SOLN
INTRAVENOUS | Status: DC | PRN
  Administered 2016-03-30: 1000 mg via INTRAVENOUS

## 2016-03-30 MED ORDER — HEPARIN SODIUM (PORCINE) 5000 UNIT/ML IJ SOLN
5000.0000 [IU] | Freq: Three times a day (TID) | INTRAMUSCULAR | Status: DC
  Administered 2016-03-30 – 2016-03-31 (×3): 5000 [IU] via SUBCUTANEOUS
  Filled 2016-03-30 (×3): qty 1

## 2016-03-30 MED ORDER — ACETAMINOPHEN 10 MG/ML IV SOLN
INTRAVENOUS | Status: AC
  Filled 2016-03-30: qty 100

## 2016-03-30 MED ORDER — HYDROCODONE-ACETAMINOPHEN 5-325 MG PO TABS
1.0000 | ORAL_TABLET | ORAL | Status: DC | PRN
  Administered 2016-03-30 – 2016-03-31 (×2): 1 via ORAL
  Filled 2016-03-30 (×2): qty 1

## 2016-03-30 MED ORDER — PROMETHAZINE HCL 25 MG/ML IJ SOLN: 6.2500 mg | INTRAMUSCULAR | Status: DC | PRN

## 2016-03-30 MED ORDER — ROSUVASTATIN CALCIUM 5 MG PO TABS
5.0000 mg | ORAL_TABLET | Freq: Every day | ORAL | Status: DC
  Administered 2016-03-30: 5 mg via ORAL
  Filled 2016-03-30: qty 1

## 2016-03-30 MED ORDER — MORPHINE SULFATE (PF) 2 MG/ML IV SOLN: 2.0000 mg | INTRAVENOUS | Status: DC | PRN

## 2016-03-30 MED ORDER — HEPARIN SODIUM (PORCINE) 5000 UNIT/ML IJ SOLN
5000.0000 [IU] | Freq: Once | INTRAMUSCULAR | Status: AC
  Administered 2016-03-30: 5000 [IU] via SUBCUTANEOUS

## 2016-03-30 MED ORDER — MEPERIDINE HCL 25 MG/ML IJ SOLN: 6.2500 mg | INTRAMUSCULAR | Status: DC | PRN

## 2016-03-30 MED ORDER — EPHEDRINE SULFATE 50 MG/ML IJ SOLN
INTRAMUSCULAR | Status: DC | PRN
  Administered 2016-03-30: 10 mg via INTRAVENOUS

## 2016-03-30 MED ORDER — SUGAMMADEX SODIUM 200 MG/2ML IV SOLN
INTRAVENOUS | Status: DC | PRN
  Administered 2016-03-30: 300 mg via INTRAVENOUS

## 2016-03-30 MED ORDER — FENTANYL CITRATE (PF) 100 MCG/2ML IJ SOLN
INTRAMUSCULAR | Status: DC | PRN
  Administered 2016-03-30: 100 ug via INTRAVENOUS

## 2016-03-30 MED ORDER — POTASSIUM CHLORIDE 20 MEQ PO PACK
20.0000 meq | PACK | Freq: Two times a day (BID) | ORAL | Status: DC
  Administered 2016-03-30 – 2016-03-31 (×3): 20 meq via ORAL
  Filled 2016-03-30 (×3): qty 1

## 2016-03-30 MED ORDER — VERAPAMIL HCL ER 240 MG PO TBCR
240.0000 mg | EXTENDED_RELEASE_TABLET | Freq: Every day | ORAL | Status: DC
  Administered 2016-03-31: 240 mg via ORAL
  Filled 2016-03-30 (×2): qty 1

## 2016-03-30 MED ORDER — BUPIVACAINE-EPINEPHRINE (PF) 0.25% -1:200000 IJ SOLN
INTRAMUSCULAR | Status: DC | PRN
  Administered 2016-03-30: 25 mL via PERINEURAL

## 2016-03-30 MED ORDER — ROCURONIUM BROMIDE 100 MG/10ML IV SOLN
INTRAVENOUS | Status: DC | PRN
  Administered 2016-03-30: 10 mg via INTRAVENOUS

## 2016-03-30 MED ORDER — SODIUM CHLORIDE 0.9 % IV SOLN
INTRAVENOUS | Status: DC
  Administered 2016-03-30 – 2016-03-31 (×2): via INTRAVENOUS

## 2016-03-30 SURGICAL SUPPLY — 46 items
ADH LQ OCL WTPRF AMP STRL LF (MISCELLANEOUS) ×2
ADHESIVE MASTISOL STRL (MISCELLANEOUS) ×5 IMPLANT
APPLIER CLIP ROT 10 11.4 M/L (STAPLE) ×3
APR CLP MED LRG 11.4X10 (STAPLE) ×1
BLADE SURG SZ11 CARB STEEL (BLADE) ×3 IMPLANT
CANISTER SUCT 1200ML W/VALVE (MISCELLANEOUS) ×3 IMPLANT
CATH CHOLANGI 4FR 420404F (CATHETERS) IMPLANT
CHLORAPREP W/TINT 26ML (MISCELLANEOUS) ×5 IMPLANT
CLIP APPLIE ROT 10 11.4 M/L (STAPLE) ×1 IMPLANT
CLOSURE WOUND 1/2 X4 (GAUZE/BANDAGES/DRESSINGS) ×1
CONRAY 60ML FOR OR (MISCELLANEOUS) IMPLANT
DRAPE C-ARM XRAY 36X54 (DRAPES) IMPLANT
ELECT REM PT RETURN 9FT ADLT (ELECTROSURGICAL) ×3
ELECTRODE REM PT RTRN 9FT ADLT (ELECTROSURGICAL) ×1 IMPLANT
ENDOPOUCH RETRIEVER 10 (MISCELLANEOUS) ×3 IMPLANT
GAUZE SPONGE NON-WVN 2X2 STRL (MISCELLANEOUS) ×4 IMPLANT
GLOVE BIO SURGEON STRL SZ8 (GLOVE) ×7 IMPLANT
GOWN STRL REUS W/ TWL LRG LVL3 (GOWN DISPOSABLE) ×4 IMPLANT
GOWN STRL REUS W/TWL LRG LVL3 (GOWN DISPOSABLE) ×12
HEMOSTAT SURGICEL 2X3 (HEMOSTASIS) ×4 IMPLANT
IRRIGATION STRYKERFLOW (MISCELLANEOUS) IMPLANT
IRRIGATOR STRYKERFLOW (MISCELLANEOUS)
IV CATH ANGIO 12GX3 LT BLUE (NEEDLE) ×1 IMPLANT
IV NS 1000ML (IV SOLUTION)
IV NS 1000ML BAXH (IV SOLUTION) IMPLANT
JACKSON PRATT 10 (INSTRUMENTS) IMPLANT
KIT RM TURNOVER STRD PROC AR (KITS) ×3 IMPLANT
LABEL OR SOLS (LABEL) ×3 IMPLANT
NDL SAFETY 22GX1.5 (NEEDLE) ×3 IMPLANT
NEEDLE VERESS 14GA 120MM (NEEDLE) ×3 IMPLANT
NS IRRIG 500ML POUR BTL (IV SOLUTION) ×3 IMPLANT
PACK LAP CHOLECYSTECTOMY (MISCELLANEOUS) ×3 IMPLANT
SCISSORS METZENBAUM CVD 33 (INSTRUMENTS) ×3 IMPLANT
SLEEVE ENDOPATH XCEL 5M (ENDOMECHANICALS) ×6 IMPLANT
SPONGE EXCIL AMD DRAIN 4X4 6P (MISCELLANEOUS) IMPLANT
SPONGE LAP 18X18 5 PK (GAUZE/BANDAGES/DRESSINGS) ×3 IMPLANT
SPONGE VERSALON 2X2 STRL (MISCELLANEOUS) ×12
STRIP CLOSURE SKIN 1/2X4 (GAUZE/BANDAGES/DRESSINGS) ×2 IMPLANT
SUT MNCRL 4-0 (SUTURE) ×3
SUT MNCRL 4-0 27XMFL (SUTURE) ×1
SUT VICRYL 0 AB UR-6 (SUTURE) ×3 IMPLANT
SUTURE MNCRL 4-0 27XMF (SUTURE) ×1 IMPLANT
SYR 20CC LL (SYRINGE) ×3 IMPLANT
TROCAR XCEL NON-BLD 11X100MML (ENDOMECHANICALS) ×3 IMPLANT
TROCAR XCEL NON-BLD 5MMX100MML (ENDOMECHANICALS) ×3 IMPLANT
TUBING INSUFFLATOR HI FLOW (MISCELLANEOUS) ×3 IMPLANT

## 2016-03-30 NOTE — Transfer of Care (Signed)
Immediate Anesthesia Transfer of Care Note  Patient: Glenda Zavala  Procedure(s) Performed: Procedure(s): LAPAROSCOPIC CHOLECYSTECTOMY (N/A)  Patient Location: PACU  Anesthesia Type:General  Level of Consciousness: sedated  Airway & Oxygen Therapy: Patient Spontanous Breathing and Patient connected to face mask oxygen  Post-op Assessment: Report given to RN and Post -op Vital signs reviewed and stable  Post vital signs: Reviewed and stable  Last Vitals:  Vitals:   03/30/16 0846 03/30/16 1127  BP: (!) 157/97 (!) 151/85  Pulse: 63 63  Resp: 16 16  Temp: 36.4 C 36.3 C    Last Pain: There were no vitals filed for this visit.       Complications: No apparent anesthesia complications

## 2016-03-30 NOTE — Anesthesia Postprocedure Evaluation (Signed)
Anesthesia Post Note  Patient: Glenda Zavala  Procedure(s) Performed: Procedure(s) (LRB): LAPAROSCOPIC CHOLECYSTECTOMY (N/A)  Patient location during evaluation: PACU Anesthesia Type: General Level of consciousness: awake and alert and oriented Pain management: pain level controlled Vital Signs Assessment: post-procedure vital signs reviewed and stable Respiratory status: spontaneous breathing, nonlabored ventilation and respiratory function stable Cardiovascular status: blood pressure returned to baseline and stable Postop Assessment: no signs of nausea or vomiting Anesthetic complications: no    Last Vitals:  Vitals:   03/30/16 1252 03/30/16 1340  BP: 130/62 139/83  Pulse: 64 62  Resp: 18 18  Temp: 36.8 C 36.6 C    Last Pain:  Vitals:   03/30/16 1340  TempSrc: Oral  PainSc:                  Quyen C[place]forth

## 2016-03-30 NOTE — Progress Notes (Signed)
Preoperative Review   Patient is met in the preoperative holding area. The history is reviewed in the chart and with the patient. I personally reviewed the options and rationale as well as the risks of this procedure that have been previously discussed with the patient. All questions asked by the patient and/or family were answered to their satisfaction.  Patient agrees to proceed with this procedure at this time.  Ernesteen Mihalic E Logyn Dedominicis M.D. FACS  

## 2016-03-30 NOTE — Anesthesia Procedure Notes (Signed)
Procedure Name: Intubation Date/Time: 03/30/2016 10:30 AM Performed by: Nelda Marseille Pre-anesthesia Checklist: Patient identified, Patient being monitored, Timeout performed, Emergency Drugs available and Suction available Patient Re-evaluated:Patient Re-evaluated prior to inductionOxygen Delivery Method: Circle system utilized Preoxygenation: Pre-oxygenation with 100% oxygen Intubation Type: IV induction Ventilation: Mask ventilation without difficulty Laryngoscope Size: Mac and 3 Grade View: Grade I Tube type: Oral Tube size: 7.0 mm Number of attempts: 1 Airway Equipment and Method: Stylet Placement Confirmation: ETT inserted through vocal cords under direct vision,  positive ETCO2 and breath sounds checked- equal and bilateral Secured at: 21 cm Tube secured with: Tape Dental Injury: Teeth and Oropharynx as per pre-operative assessment

## 2016-03-30 NOTE — Anesthesia Preprocedure Evaluation (Signed)
Anesthesia Evaluation  Patient identified by MRN, date of birth, ID band Patient awake    Reviewed: Allergy & Precautions, NPO status , Patient's Chart, lab work & pertinent test results  History of Anesthesia Complications Negative for: history of anesthetic complications  Airway Mallampati: II  TM Distance: >3 FB Neck ROM: Full    Dental no notable dental hx.    Pulmonary neg sleep apnea, COPD,  COPD inhaler,    breath sounds clear to auscultation- rhonchi (-) wheezing      Cardiovascular hypertension, Pt. on medications (-) CAD and (-) Past MI  Rhythm:Regular Rate:Normal - Systolic murmurs and - Diastolic murmurs    Neuro/Psych Depression negative neurological ROS     GI/Hepatic Neg liver ROS, GERD  ,  Endo/Other  diabetes, Well Controlled, Oral Hypoglycemic AgentsMorbid obesity  Renal/GU negative Renal ROS     Musculoskeletal  (+) Arthritis ,   Abdominal (+) + obese,   Peds  Hematology  (+) anemia ,   Anesthesia Other Findings Past Medical History: No date: Anemia No date: Arthritis No date: Asthma No date: COPD (chronic obstructive pulmonary disease) (* No date: Depression No date: Diabetes mellitus (HCC) No date: Environmental allergies No date: GERD (gastroesophageal reflux disease) No date: Heart murmur No date: Hypercholesterolemia No date: Hypertension No date: PSVT (paroxysmal supraventricular tachycardia)*   Reproductive/Obstetrics negative OB ROS                             Anesthesia Physical Anesthesia Plan  ASA: III  Anesthesia Plan: General   Post-op Pain Management:    Induction: Intravenous  Airway Management Planned: Oral ETT  Additional Equipment:   Intra-op Plan:   Post-operative Plan: Extubation in OR  Informed Consent: I have reviewed the patients History and Physical, chart, labs and discussed the procedure including the risks, benefits and  alternatives for the proposed anesthesia with the patient or authorized representative who has indicated his/her understanding and acceptance.   Dental advisory given  Plan Discussed with: CRNA and Anesthesiologist  Anesthesia Plan Comments:         Anesthesia Quick Evaluation

## 2016-03-30 NOTE — Op Note (Signed)
Laparoscopic Cholecystectomy  Pre-operative Diagnosis: Biliary colic  Post-operative Diagnosis: Biliary colic  Procedure: Laparoscopic cholecystectomy  Surgeon: Jerrol Banana. Burt Knack, MD FACS  Anesthesia: Gen. with endotracheal tube  Assistant: Surgical tech  Procedure Details  The patient was seen again in the Holding Room. The benefits, complications, treatment options, and expected outcomes were discussed with the patient. The risks of bleeding, infection, recurrence of symptoms, failure to resolve symptoms, bile duct damage, bile duct leak, retained common bile duct stone, bowel injury, any of which could require further surgery and/or ERCP, stent, or papillotomy were reviewed with the patient. Also of note the patient has had a gastric restrictive procedure with an prosthetic implant with port in the right upper quadrant. We discussed the ports proximity to the operative field and the risk of damage she understood and agreed to proceed with this plan. The likelihood of improving the patient's symptoms with return to their baseline status is good.  The patient and/or family concurred with the proposed plan, giving informed consent.  The patient was taken to Operating Room, identified as Glenda Zavala and the procedure verified as Laparoscopic Cholecystectomy.  A Time Out was held and the above information confirmed.  Prior to the induction of general anesthesia, antibiotic prophylaxis was administered. VTE prophylaxis was in place. General endotracheal anesthesia was then administered and tolerated well. After the induction, the abdomen was prepped with Chloraprep and draped in the sterile fashion. The patient was positioned in the supine position.  Local anesthetic  was injected into the skin near the umbilicus and an incision made. The Veress needle was placed. Pneumoperitoneum was then created with CO2 and tolerated well without any adverse changes in the patient's vital signs. Utilizing a  Visiport extra long trocar sheath under direct vision A 66mm port was placed in the periumbilical position and the abdominal cavity was explored.  In the periumbilical area there were some adhesions that were not damaged nor were they adjacent to the port. In the epigastric region the entrance site of the prosthetic port was identified and kept in view and not damaged during the procedure. Two 5-mm ports were placed in the right upper quadrant and a 12 mm epigastric port was placed all under direct vision. All skin incisions  were infiltrated with a local anesthetic agent before making the incision and placing the trocars.   The patient was positioned  in reverse Trendelenburg, tilted slightly to the patient's left.  The gallbladder was identified, the fundus grasped and retracted cephalad. Adhesions were lysed bluntly. The infundibulum was grasped and retracted laterally, exposing the peritoneum overlying the triangle of Calot. This was then divided and exposed in a blunt fashion. A critical view of the cystic duct and cystic artery was obtained.  The cystic duct was clearly identified and bluntly dissected.   The cystic duct was doubly clipped and divided the cystic artery was doubly clipped and divided.  The gallbladder was taken from the gallbladder fossa in a retrograde fashion with the electrocautery. The gallbladder was removed and placed in an Endocatch bag. The liver bed was irrigated and inspected. Hemostasis was achieved with the electrocautery. Copious irrigation was utilized and was repeatedly aspirated until clear.  The gallbladder and Endocatch sac were then removed through the epigastric port site.   There was some minor bleeding from the gallbladder fossa and 2 pieces of Surgicel were placed that controlled the mild hemorrhage.  Inspection of the right upper quadrant was performed. No bleeding, bile duct injury or  leak, or bowel injury was noted. The prosthetic port line was identified in  the epigastrium and had not been damaged. Pneumoperitoneum was released.  The epigastric port site was closed with figure-of-eight 0 Vicryl sutures. 4-0 subcuticular Monocryl was used to close the skin. Steristrips and Mastisol and sterile dressings were  applied.  The patient was then extubated and brought to the recovery room in stable condition. Sponge, lap, and needle counts were correct at closure and at the conclusion of the case.   Findings: Chronic Cholecystitis   Estimated Blood Loss: 25         Drains: None         Specimens: Gallbladder           Complications: none               Victory Strollo E. Burt Knack, MD, FACS

## 2016-03-31 ENCOUNTER — Other Ambulatory Visit: Payer: Self-pay | Admitting: Internal Medicine

## 2016-03-31 DIAGNOSIS — Z825 Family history of asthma and other chronic lower respiratory diseases: Secondary | ICD-10-CM | POA: Diagnosis not present

## 2016-03-31 DIAGNOSIS — E7800 Pure hypercholesterolemia, unspecified: Secondary | ICD-10-CM | POA: Diagnosis not present

## 2016-03-31 DIAGNOSIS — Z8249 Family history of ischemic heart disease and other diseases of the circulatory system: Secondary | ICD-10-CM | POA: Diagnosis not present

## 2016-03-31 DIAGNOSIS — Z8049 Family history of malignant neoplasm of other genital organs: Secondary | ICD-10-CM | POA: Diagnosis not present

## 2016-03-31 DIAGNOSIS — K828 Other specified diseases of gallbladder: Secondary | ICD-10-CM | POA: Diagnosis not present

## 2016-03-31 DIAGNOSIS — K8010 Calculus of gallbladder with chronic cholecystitis without obstruction: Secondary | ICD-10-CM | POA: Diagnosis not present

## 2016-03-31 DIAGNOSIS — E119 Type 2 diabetes mellitus without complications: Secondary | ICD-10-CM | POA: Diagnosis not present

## 2016-03-31 DIAGNOSIS — Z9884 Bariatric surgery status: Secondary | ICD-10-CM | POA: Diagnosis not present

## 2016-03-31 DIAGNOSIS — I472 Ventricular tachycardia: Secondary | ICD-10-CM | POA: Diagnosis not present

## 2016-03-31 DIAGNOSIS — I10 Essential (primary) hypertension: Secondary | ICD-10-CM | POA: Diagnosis not present

## 2016-03-31 DIAGNOSIS — J449 Chronic obstructive pulmonary disease, unspecified: Secondary | ICD-10-CM | POA: Diagnosis not present

## 2016-03-31 DIAGNOSIS — M1990 Unspecified osteoarthritis, unspecified site: Secondary | ICD-10-CM | POA: Diagnosis not present

## 2016-03-31 DIAGNOSIS — K219 Gastro-esophageal reflux disease without esophagitis: Secondary | ICD-10-CM | POA: Diagnosis not present

## 2016-03-31 LAB — CBC
HEMATOCRIT: 35 % (ref 35.0–47.0)
HEMOGLOBIN: 11.5 g/dL — AB (ref 12.0–16.0)
MCH: 26.6 pg (ref 26.0–34.0)
MCHC: 33 g/dL (ref 32.0–36.0)
MCV: 80.4 fL (ref 80.0–100.0)
Platelets: 224 10*3/uL (ref 150–440)
RBC: 4.35 MIL/uL (ref 3.80–5.20)
RDW: 14.3 % (ref 11.5–14.5)
WBC: 10.9 10*3/uL (ref 3.6–11.0)

## 2016-03-31 LAB — COMPREHENSIVE METABOLIC PANEL
ALT: 18 U/L (ref 14–54)
ANION GAP: 3 — AB (ref 5–15)
AST: 15 U/L (ref 15–41)
Albumin: 3.1 g/dL — ABNORMAL LOW (ref 3.5–5.0)
Alkaline Phosphatase: 58 U/L (ref 38–126)
BILIRUBIN TOTAL: 0.3 mg/dL (ref 0.3–1.2)
BUN: 15 mg/dL (ref 6–20)
CO2: 27 mmol/L (ref 22–32)
Calcium: 8.3 mg/dL — ABNORMAL LOW (ref 8.9–10.3)
Chloride: 108 mmol/L (ref 101–111)
Creatinine, Ser: 0.66 mg/dL (ref 0.44–1.00)
Glucose, Bld: 144 mg/dL — ABNORMAL HIGH (ref 65–99)
POTASSIUM: 4.6 mmol/L (ref 3.5–5.1)
Sodium: 138 mmol/L (ref 135–145)
TOTAL PROTEIN: 6 g/dL — AB (ref 6.5–8.1)

## 2016-03-31 MED ORDER — HYDROCODONE-ACETAMINOPHEN 5-325 MG PO TABS: 1.0000 | ORAL_TABLET | ORAL | 0 refills | Status: DC | PRN

## 2016-03-31 NOTE — Progress Notes (Signed)
Alert and oriented. Vital signs stable . No signs of acute distress. Discharge instructions given. Patient verbalizes understanding. No other issues noted at this time.   

## 2016-03-31 NOTE — Progress Notes (Signed)
1 Day Post-Op  Subjective: Patient feels well today has no complaints tolerating a diet  Objective: Vital signs in last 24 hours: Temp:  [97 F (36.1 C)-98.3 F (36.8 C)] 97.7 F (36.5 C) (08/10 0433) Pulse Rate:  [51-71] 59 (08/10 0433) Resp:  [10-20] 19 (08/10 0433) BP: (102-174)/(45-97) 102/45 (08/10 0433) SpO2:  [93 %-100 %] 94 % (08/10 0433) Weight:  [321 lb 6.4 oz (145.8 kg)] 321 lb 6.4 oz (145.8 kg) (08/09 1300) Last BM Date: 03/29/16  Intake/Output from previous day: 08/09 0701 - 08/10 0700 In: 1323 [I.V.:1323] Out: 2240 [Urine:2230; Blood:10] Intake/Output this shift: Total I/O In: 472 [I.V.:472] Out: -   Physical exam:  Obese nontender abdomen wounds are dressed and clean no drainage no erythema no jaundice no icterus nontender calves  Lab Results: CBC   Recent Labs  03/30/16 1325 03/31/16 0518  WBC 11.5* 10.9  HGB 13.2 11.5*  HCT 41.1 35.0  PLT 151 224   BMET  Recent Labs  03/30/16 0928 03/30/16 1325 03/31/16 0518  NA 140  --  138  K 4.0  --  4.6  CL  --   --  108  CO2  --   --  27  GLUCOSE 103*  --  144*  BUN  --   --  15  CREATININE  --  0.74 0.66  CALCIUM  --   --  8.3*   PT/INR No results for input(s): LABPROT, INR in the last 72 hours. ABG No results for input(s): PHART, HCO3 in the last 72 hours.  Invalid input(s): PCO2, PO2  Studies/Results: No results found.  Anti-infectives: Anti-infectives    Start     Dose/Rate Route Frequency Ordered Stop   03/30/16 0407  ceFAZolin (ANCEF) 3 g in dextrose 5 % 50 mL IVPB     3 g 130 mL/hr over 30 Minutes Intravenous On call to O.R. 03/30/16 0407 03/30/16 1047      Assessment/Plan: s/p Procedure(s): LAPAROSCOPIC CHOLECYSTECTOMY   Patient doing very well after laparoscopic cholecystectomy will discharge today to follow-up in the office in 10 days  Florene Glen, MD, FACS  03/31/2016

## 2016-03-31 NOTE — Discharge Instructions (Signed)
Remove dressing in 24 hours. °May shower in 24 hours. °Leave paper strips in place. °Resume all home medications. °Follow-up with Dr. Selden Noteboom in 10 days. °

## 2016-04-01 ENCOUNTER — Telehealth: Payer: Self-pay

## 2016-04-01 LAB — SURGICAL PATHOLOGY

## 2016-04-01 NOTE — Telephone Encounter (Signed)
Called patient and she states she is recovering well after removal of gall bladder.  Hospital follow up to be completed by surgeon.  Upcoming physical with PCP rescheduled per her request.

## 2016-04-07 NOTE — Progress Notes (Signed)
xxxxxx

## 2016-04-12 ENCOUNTER — Encounter: Payer: Self-pay | Admitting: General Surgery

## 2016-04-12 ENCOUNTER — Ambulatory Visit (INDEPENDENT_AMBULATORY_CARE_PROVIDER_SITE_OTHER): Payer: Medicare Other | Admitting: General Surgery

## 2016-04-12 VITALS — BP 141/68 | HR 72 | Temp 98.7°F | Ht 63.0 in | Wt 303.0 lb

## 2016-04-12 DIAGNOSIS — Z4889 Encounter for other specified surgical aftercare: Secondary | ICD-10-CM

## 2016-04-12 NOTE — Patient Instructions (Signed)
Please call our office if you have any questions or concerns.  

## 2016-04-12 NOTE — Progress Notes (Signed)
Outpatient Surgical Follow Up  04/12/2016  Glenda Zavala is an 65 y.o. female.   Chief Complaint  Patient presents with  . Routine Post Op    Laparoscopy Cholecystectomy-03/30/16-Dr.Cooper    HPI: 65 year old female returns to clinic 2 weeks status post laparoscopic cholecystectomy. Patient reports she's done very well since surgery and denies any fevers, chills, nausea, vomiting, chest pain, shortness of breath, diarrhea, constipation. She has had some looser stools, especially he's fattier foods. However she states it has not been bad. She only had pain the first night after surgery and has since been pain free. Her only current pain complaint is the occasional evening heartburn. She's been very happy with her surgical experience.  Past Medical History:  Diagnosis Date  . Anemia   . Arthritis   . Asthma   . COPD (chronic obstructive pulmonary disease) ([place])   . Depression   . Diabetes mellitus ([place])   . Environmental allergies   . GERD (gastroesophageal reflux disease)   . Heart murmur   . Hypercholesterolemia   . Hypertension   . PSVT (paroxysmal supraventricular tachycardia) ([place])     Past Surgical History:  Procedure Laterality Date  . ABDOMINAL HYSTERECTOMY  1990   secondary to bleeding and hyperplasia  . CESAREAN SECTION    . CHOLECYSTECTOMY N/A 03/30/2016   Procedure: LAPAROSCOPIC CHOLECYSTECTOMY;  Surgeon: Florene Glen, MD;  Location: [facility] ORS;  Service: General;  Laterality: N/A;  . LAPAROSCOPIC GASTRIC BANDING  2010  . TONSILLECTOMY    . TUBAL LIGATION      Family History  Problem Relation Age of Onset  . Hypercholesterolemia Mother   . Emphysema Maternal Grandfather   . Uterine cancer      aunt  . Breast cancer Neg Hx   . Colon cancer Neg Hx     Social History:  reports that she has never smoked. She has never used smokeless tobacco. She reports that she does not drink alcohol or use drugs.  Allergies: No Known Allergies  Medications  reviewed.    ROS A multipoint review of systems was completed, all pertinent positives and negatives are documented within the history of present illness and remainder are negative.   BP (!) 141/68 (BP Location: Right Arm, Patient Position: Sitting, Cuff Size: Normal)   Pulse 72   Temp 98.7 F (37.1 C) (Oral)   Ht 5\' 3"  (1.6 m)   Wt (!) 137.4 kg (303 lb)   BMI 53.67 kg/m   Physical Exam Gen.: No acute distress Chest: Clear to auscultation Heart: Regular rhythm Abdomen: Large, soft, nontender. Well approximated laparoscopic incision sites without evidence of infection or drainage.    No results found for this or any previous visit (from the past 48 hour(s)). No results found.  Assessment/Plan:  1. Aftercare following surgery 65 year old female status post laparoscopic cholecystectomy. Pathology reviewed with the patient. Provided with standard postoperative precautions. All questions answered to the patient's satisfaction. She'll follow up in the clinic on an as-needed basis.     Clayburn Pert, MD FACS General Surgeon  04/12/2016,9:30 AM

## 2016-04-19 ENCOUNTER — Encounter: Payer: Self-pay | Admitting: Internal Medicine

## 2016-06-13 ENCOUNTER — Encounter: Payer: Self-pay | Admitting: Internal Medicine

## 2016-06-13 ENCOUNTER — Other Ambulatory Visit: Payer: Self-pay

## 2016-06-13 ENCOUNTER — Other Ambulatory Visit (HOSPITAL_COMMUNITY)
Admission: RE | Admit: 2016-06-13 | Discharge: 2016-06-13 | Disposition: A | Discharge status: Home / Self Care | Payer: Managed Care, Other (non HMO) | Source: Ambulatory Visit | Attending: Internal Medicine | Admitting: Internal Medicine

## 2016-06-13 ENCOUNTER — Ambulatory Visit (INDEPENDENT_AMBULATORY_CARE_PROVIDER_SITE_OTHER): Payer: Managed Care, Other (non HMO) | Admitting: Internal Medicine

## 2016-06-13 VITALS — BP 110/60 | HR 59 | Temp 98.4°F | Ht 63.0 in | Wt 309.2 lb

## 2016-06-13 DIAGNOSIS — Z299 Encounter for prophylactic measures, unspecified: Secondary | ICD-10-CM

## 2016-06-13 DIAGNOSIS — Z0000 Encounter for general adult medical examination without abnormal findings: Secondary | ICD-10-CM

## 2016-06-13 DIAGNOSIS — J4520 Mild intermittent asthma, uncomplicated: Secondary | ICD-10-CM

## 2016-06-13 DIAGNOSIS — F329 Major depressive disorder, single episode, unspecified: Secondary | ICD-10-CM

## 2016-06-13 DIAGNOSIS — Z01419 Encounter for gynecological examination (general) (routine) without abnormal findings: Secondary | ICD-10-CM | POA: Insufficient documentation

## 2016-06-13 DIAGNOSIS — Z1151 Encounter for screening for human papillomavirus (HPV): Secondary | ICD-10-CM | POA: Diagnosis present

## 2016-06-13 DIAGNOSIS — F32A Depression, unspecified: Secondary | ICD-10-CM

## 2016-06-13 DIAGNOSIS — I4892 Unspecified atrial flutter: Secondary | ICD-10-CM

## 2016-06-13 DIAGNOSIS — I10 Essential (primary) hypertension: Secondary | ICD-10-CM | POA: Diagnosis not present

## 2016-06-13 DIAGNOSIS — E119 Type 2 diabetes mellitus without complications: Secondary | ICD-10-CM

## 2016-06-13 DIAGNOSIS — M25569 Pain in unspecified knee: Secondary | ICD-10-CM

## 2016-06-13 DIAGNOSIS — Z124 Encounter for screening for malignant neoplasm of cervix: Secondary | ICD-10-CM

## 2016-06-13 DIAGNOSIS — G8929 Other chronic pain: Secondary | ICD-10-CM

## 2016-06-13 DIAGNOSIS — E7800 Pure hypercholesterolemia, unspecified: Secondary | ICD-10-CM

## 2016-06-13 DIAGNOSIS — I4891 Unspecified atrial fibrillation: Secondary | ICD-10-CM

## 2016-06-13 NOTE — Progress Notes (Signed)
Patient came in to have blood drawn for testing per Dr. Charlene Scott's orders. 

## 2016-06-13 NOTE — Progress Notes (Signed)
Patient ID: Glenda Zavala, female   DOB: 1951-03-07, 65 y.o.   MRN: [id number]   Subjective:    Patient ID: Glenda Zavala, female    DOB: 10-15-1950, 65 y.o.   MRN: [id number]  HPI  Patient here for a physical exam.  She recently had gallbladder surgery.  Doing well.  Bowels overall doing ok.  Some loose stool at times.  She is eating.  No nausea or vomiting.  No chest pain.  Breathing stable.  Saw cardiology 04/29/16.  Stable.  No changes made.  No abdominal pain.  Increased stress with her daughters medical issues.  Discussed with her today.     Past Medical History:  Diagnosis Date  . Anemia   . Arthritis   . Asthma   . COPD (chronic obstructive pulmonary disease) ([place])   . Depression   . Diabetes mellitus ([place])   . Environmental allergies   . GERD (gastroesophageal reflux disease)   . Heart murmur   . Hypercholesterolemia   . Hypertension   . PSVT (paroxysmal supraventricular tachycardia) ([place])    Past Surgical History:  Procedure Laterality Date  . ABDOMINAL HYSTERECTOMY  1990   secondary to bleeding and hyperplasia  . CESAREAN SECTION    . CHOLECYSTECTOMY N/A 03/30/2016   Procedure: LAPAROSCOPIC CHOLECYSTECTOMY;  Surgeon: Florene Glen, MD;  Location: [facility] ORS;  Service: General;  Laterality: N/A;  . LAPAROSCOPIC GASTRIC BANDING  2010  . TONSILLECTOMY    . TUBAL LIGATION     Family History  Problem Relation Age of Onset  . Hypercholesterolemia Mother   . Emphysema Maternal Grandfather   . Uterine cancer      aunt  . Breast cancer Neg Hx   . Colon cancer Neg Hx    Social History   Social History  . Marital status: Married    Spouse name: N/A  . Number of children: 2  . Years of education: N/A   Occupational History  . teacher    Social History Main Topics  . Smoking status: Never Smoker  . Smokeless tobacco: Never Used  . Alcohol use No  . Drug use: No  . Sexual activity: Not Asked   Other Topics Concern  . None   Social History Narrative  .  None    Outpatient Encounter Prescriptions as of 06/13/2016  Medication Sig  . acetaminophen (TYLENOL) 650 MG CR tablet Take 650 mg by mouth every 8 (eight) hours as needed for pain.   Marland Kitchen albuterol (PROVENTIL HFA;VENTOLIN HFA) 108 (90 Base) MCG/ACT inhaler Inhale 2 puffs into the lungs every 6 (six) hours as needed for wheezing or shortness of breath.  Marland Kitchen albuterol (PROVENTIL) (2.5 MG/3ML) 0.083% nebulizer solution Take 3 mLs (2.5 mg total) by nebulization every 6 (six) hours as needed for shortness of breath.  . Blood Glucose Monitoring Suppl (ONETOUCH VERIO IQ SYSTEM) W/DEVICE KIT AS DIRECTED  . citalopram (CELEXA) 40 MG tablet TAKE ONE (1) TABLET BY MOUTH EVERY DAY  . cyanocobalamin (,VITAMIN B-12,) 1000 MCG/ML injection Inject 1,000 mcg into the muscle every 30 (thirty) days.  . fluticasone (FLONASE) 50 MCG/ACT nasal spray USE 2 SPRAYS EACH NOSTRIL ONE TIME DAILY (Patient taking differently: USE 2 SPRAYS EACH NOSTRIL ONE TIME DAILY AS NEEDED FOR ALLERGIES)  . glucose blood (ONETOUCH VERIO) test strip TEST BLOOD GLUCOSE TWICE DAILY  . hydrochlorothiazide (HYDRODIURIL) 25 MG tablet TAKE ONE (1) TABLET EACH DAY  . Lancets (ACCU-CHEK SOFT TOUCH) lancets Use as instructed  . lisinopril (PRINIVIL,ZESTRIL)  40 MG tablet TAKE ONE TABLET BY MOUTH EVERY MORNING FOR HIGH BLOOD PRESSURE  . metFORMIN (GLUCOPHAGE) 1000 MG tablet TAKE ONE TABLET TWICE DAILY  . montelukast (SINGULAIR) 10 MG tablet Take 10 mg by mouth at bedtime as needed (allergies).   . Multiple Vitamin (MULTIVITAMIN WITH MINERALS) TABS tablet Take 1 tablet by mouth daily.  Marland Kitchen nystatin (MYCOSTATIN) powder APPLY TO AFFECTED AREAS TWICE DAILY (Patient taking differently: APPLY TO AFFECTED AREAS TWICE DAILY AS NEEDED)  . nystatin cream (MYCOSTATIN) APPLY TO AFFECTED AREAS TOPICALLY TWICE A DAY (Patient taking differently: APPLY TO AFFECTED AREAS TOPICALLY TWICE A DAY AS NEEDED)  . rosuvastatin (CRESTOR) 5 MG tablet TAKE ONE TABLET BY MOUTH EVERY  NIGHT AT BEDTIME  . tiotropium (SPIRIVA HANDIHALER) 18 MCG inhalation capsule INHALE CONTENTS OF ONE CAPSULE ONCE A DAY AS DIRECTED  . verapamil (CALAN-SR) 240 MG CR tablet TAKE ONE (1) TABLET EACH DAY  . [DISCONTINUED] potassium chloride (KLOR-CON) 20 MEQ packet Take 20 mEq by mouth 2 (two) times daily.   No facility-administered encounter medications on file as of 06/13/2016.     Review of Systems  Constitutional: Negative for appetite change and unexpected weight change.  HENT: Negative for congestion and sinus pressure.   Eyes: Negative for pain and visual disturbance.  Respiratory: Negative for cough, chest tightness and shortness of breath.   Cardiovascular: Negative for chest pain, palpitations and leg swelling.  Gastrointestinal: Negative for abdominal pain, diarrhea, nausea and vomiting.  Genitourinary: Negative for difficulty urinating and dysuria.  Musculoskeletal: Negative for back pain and myalgias.  Skin: Negative for color change and rash.  Neurological: Negative for dizziness, light-headedness and headaches.  Hematological: Negative for adenopathy. Does not bruise/bleed easily.  Psychiatric/Behavioral: Negative for agitation and dysphoric mood.       Increased stress as outlined.         Objective:     Blood pressure rechecked by me:  120/76  Physical Exam  Constitutional: She is oriented to person, place, and time. She appears well-developed and well-nourished. No distress.  HENT:  Nose: Nose normal.  Mouth/Throat: Oropharynx is clear and moist.  Eyes: Right eye exhibits no discharge. Left eye exhibits no discharge. No scleral icterus.  Neck: Neck supple. No thyromegaly present.  Cardiovascular: Normal rate and regular rhythm.   Pulmonary/Chest: Breath sounds normal. No accessory muscle usage. No tachypnea. No respiratory distress. She has no decreased breath sounds. She has no wheezes. She has no rhonchi. Right breast exhibits no inverted nipple, no mass, no  nipple discharge and no tenderness (no axillary adenopathy). Left breast exhibits no inverted nipple, no mass, no nipple discharge and no tenderness (no axilarry adenopathy).  Abdominal: Soft. Bowel sounds are normal. There is no tenderness.  Genitourinary:  Genitourinary Comments: Normal external genitalia.  Vaginal vault without lesions.  S/p hysterectomy.  Could not appreciate any adnexal masses or tenderness.  PAP performed of the vaginal cuff.  Musculoskeletal: She exhibits no edema or tenderness.  Lymphadenopathy:    She has no cervical adenopathy.  Neurological: She is alert and oriented to person, place, and time.  Skin: Skin is warm. No rash noted. No erythema.  Psychiatric: She has a normal mood and affect. Her behavior is normal.    BP 110/60   Pulse (!) 59   Temp 98.4 F (36.9 C) (Oral)   Ht _0  (1.6 m)   Wt (!) 309 lb 3.2 oz (140.3 kg)   SpO2 95%   BMI 54.77 kg/m  Wt Readings  from Last 3 Encounters:  06/13/16 (!) 309 lb 3.2 oz (140.3 kg)  04/12/16 (!) 303 lb (137.4 kg)  03/30/16 (!) 321 lb 6.4 oz (145.8 kg)     Lab Results  Component Value Date   WBC 10.9 03/31/2016   HGB 11.5 (L) 03/31/2016   HCT 35.0 03/31/2016   PLT 224 03/31/2016   GLUCOSE 144 (H) 03/31/2016   CHOL 184 08/14/2015   TRIG 69 08/14/2015   HDL 60 08/14/2015   LDLCALC 110 (H) 08/14/2015   ALT 18 03/31/2016   AST 15 03/31/2016   NA 138 03/31/2016   K 4.6 03/31/2016   CL 108 03/31/2016   CREATININE 0.66 03/31/2016   BUN 15 03/31/2016   CO2 27 03/31/2016   TSH 3.340 08/14/2015   HGBA1C 6.5 (H) 08/14/2015   MICROALBUR <0.7 08/19/2015       Assessment & Plan:   Problem List Items Addressed This Visit    Asthma    Breathing stable.        Atrial fibrillation and flutter ([place])    In SR now.  Seeing cardiology.  Doing well.       Depression    Increased stress as outlined.  Discussed with her today. On citalopram.  Follow.        Diabetes mellitus ([place])    Low carb diet and  exercise.  Follow met b and a1c.  States just had labs drawn.  Will need results.        Health care maintenance    Physical today 06/13/16.  Colonoscopy 12/2012.  Recommended f/u in 5-10 years.  Mammogram 08/27/15 - Birads I.  PAP 06/13/16.       Hypertension    Blood pressure under good control.  Continue same medication regimen.  Follow pressures.  Follow metabolic panel.        Knee pain    Persistent. Has seen ortho.       Morbid obesity ([place])    Discussed diet and exercise.  Follow.       Pure hypercholesterolemia    Low cholesterol diet and exercise.  On crestor.  Follow lipid panel and liver function tests.          Other Visit Diagnoses    Screening for cervical cancer    -  Primary   Relevant Orders   Cytology - PAP       Einar Pheasant, MD

## 2016-06-13 NOTE — Progress Notes (Signed)
Pre visit review using our clinic review tool, if applicable. No additional management support is needed unless otherwise documented below in the visit note. 

## 2016-06-13 NOTE — Assessment & Plan Note (Addendum)
Physical today 06/13/16.  Colonoscopy 12/2012.  Recommended f/u in 5-10 years.  Mammogram 08/27/15 - Birads I.  PAP 06/13/16.

## 2016-06-14 ENCOUNTER — Encounter: Payer: Self-pay | Admitting: Internal Medicine

## 2016-06-14 LAB — CMP12+LP+TP+TSH+6AC+CBC/D/PLT
A/G RATIO: 1.6 (ref 1.2–2.2)
ALBUMIN: 3.9 g/dL (ref 3.6–4.8)
ALT: 9 IU/L (ref 0–32)
AST: 6 IU/L (ref 0–40)
Alkaline Phosphatase: 79 IU/L (ref 39–117)
BASOS ABS: 0 10*3/uL (ref 0.0–0.2)
BILIRUBIN TOTAL: 0.4 mg/dL (ref 0.0–1.2)
BUN / CREAT RATIO: 24 (ref 12–28)
BUN: 17 mg/dL (ref 8–27)
Basos: 0 %
CHOLESTEROL TOTAL: 171 mg/dL (ref 100–199)
Calcium: 9 mg/dL (ref 8.7–10.3)
Chloride: 95 mmol/L — ABNORMAL LOW (ref 96–106)
Chol/HDL Ratio: 2.9 ratio units (ref 0.0–4.4)
Creatinine, Ser: 0.71 mg/dL (ref 0.57–1.00)
EOS (ABSOLUTE): 0.2 10*3/uL (ref 0.0–0.4)
EOS: 3 %
Estimated CHD Risk: <0.5 times avg. (ref 0.0–1.0)
FREE THYROXINE INDEX: 1.8 (ref 1.2–4.9)
GFR calc Af Amer: 106 mL/min/{1.73_m2} (ref >59)
GFR calc non Af Amer: 92 mL/min/{1.73_m2} (ref >59)
GGT: 9 IU/L (ref 0–60)
GLOBULIN, TOTAL: 2.4 g/dL (ref 1.5–4.5)
Glucose: 107 mg/dL — ABNORMAL HIGH (ref 65–99)
HDL: 60 mg/dL (ref >39)
HEMOGLOBIN: 12.1 g/dL (ref 11.1–15.9)
Hematocrit: 38.2 % (ref 34.0–46.6)
IMMATURE GRANULOCYTES: 0 %
IRON: 63 ug/dL (ref 27–139)
Immature Grans (Abs): 0 10*3/uL (ref 0.0–0.1)
LDH: 164 IU/L (ref 119–226)
LDL Calculated: 94 mg/dL (ref 0–99)
LYMPHS ABS: 1.9 10*3/uL (ref 0.7–3.1)
Lymphs: 26 %
MCH: 25.9 pg — AB (ref 26.6–33.0)
MCHC: 31.7 g/dL (ref 31.5–35.7)
MCV: 82 fL (ref 79–97)
Monocytes Absolute: 0.5 10*3/uL (ref 0.1–0.9)
Monocytes: 7 %
NEUTROS PCT: 64 %
Neutrophils Absolute: 4.7 10*3/uL (ref 1.4–7.0)
PLATELETS: 275 10*3/uL (ref 150–379)
Phosphorus: 4.1 mg/dL (ref 2.5–4.5)
Potassium: 4 mmol/L (ref 3.5–5.2)
RBC: 4.67 x10E6/uL (ref 3.77–5.28)
RDW: 14.8 % (ref 12.3–15.4)
Sodium: 139 mmol/L (ref 134–144)
T3 UPTAKE RATIO: 27 % (ref 24–39)
T4, Total: 6.7 ug/dL (ref 4.5–12.0)
TOTAL PROTEIN: 6.3 g/dL (ref 6.0–8.5)
TSH: 3.56 u[IU]/mL (ref 0.450–4.500)
Triglycerides: 84 mg/dL (ref 0–149)
Uric Acid: 5.7 mg/dL (ref 2.5–7.1)
VLDL CHOLESTEROL CAL: 17 mg/dL (ref 5–40)
WBC: 7.3 10*3/uL (ref 3.4–10.8)

## 2016-06-14 LAB — FERRITIN: Ferritin: 24 ng/mL (ref 15–150)

## 2016-06-14 LAB — HGB A1C W/O EAG: Hgb A1c MFr Bld: 6.3 % — ABNORMAL HIGH (ref 4.8–5.6)

## 2016-06-14 NOTE — Assessment & Plan Note (Signed)
Increased stress as outlined.  Discussed with her today. On citalopram.  Follow.

## 2016-06-14 NOTE — Assessment & Plan Note (Signed)
Low carb diet and exercise.  Follow met b and a1c.  States just had labs drawn.  Will need results.

## 2016-06-14 NOTE — Assessment & Plan Note (Signed)
Breathing stable.

## 2016-06-14 NOTE — Assessment & Plan Note (Signed)
Blood pressure under good control.  Continue same medication regimen.  Follow pressures.  Follow metabolic panel.   

## 2016-06-14 NOTE — Assessment & Plan Note (Signed)
Low cholesterol diet and exercise.  On crestor.  Follow lipid panel and liver function tests.  

## 2016-06-14 NOTE — Assessment & Plan Note (Signed)
Discussed diet and exercise.  Follow.  

## 2016-06-14 NOTE — Assessment & Plan Note (Signed)
In SR now.  Seeing cardiology.  Doing well.

## 2016-06-14 NOTE — Assessment & Plan Note (Signed)
Persistent. Has seen ortho.

## 2016-06-15 LAB — CYTOLOGY - PAP
DIAGNOSIS: NEGATIVE
HPV: NOT DETECTED

## 2016-06-16 ENCOUNTER — Encounter: Payer: Self-pay | Admitting: Internal Medicine

## 2016-06-17 ENCOUNTER — Other Ambulatory Visit: Payer: Self-pay | Admitting: Internal Medicine

## 2016-07-01 ENCOUNTER — Other Ambulatory Visit: Payer: Self-pay | Admitting: Internal Medicine

## 2016-08-09 ENCOUNTER — Other Ambulatory Visit: Payer: Self-pay | Admitting: Internal Medicine

## 2016-08-11 ENCOUNTER — Telehealth: Payer: Self-pay | Admitting: Internal Medicine

## 2016-08-11 NOTE — Telephone Encounter (Signed)
Pt called and stated that [facility] told her to call Dr. Nicki Reaper to get a refill of her [facility] VERIO test strip Please let pt know once sent [phone]

## 2016-08-11 NOTE — Telephone Encounter (Signed)
Notified patient that RX is at pharmacy and verified with pharmacy

## 2016-09-08 DIAGNOSIS — J449 Chronic obstructive pulmonary disease, unspecified: Secondary | ICD-10-CM | POA: Diagnosis not present

## 2016-10-09 DIAGNOSIS — J449 Chronic obstructive pulmonary disease, unspecified: Secondary | ICD-10-CM | POA: Diagnosis not present

## 2016-10-15 ENCOUNTER — Other Ambulatory Visit: Payer: Self-pay | Admitting: Internal Medicine

## 2016-10-18 ENCOUNTER — Encounter: Payer: Self-pay | Admitting: Internal Medicine

## 2016-10-18 ENCOUNTER — Ambulatory Visit (INDEPENDENT_AMBULATORY_CARE_PROVIDER_SITE_OTHER): Payer: PPO

## 2016-10-18 ENCOUNTER — Ambulatory Visit (INDEPENDENT_AMBULATORY_CARE_PROVIDER_SITE_OTHER): Payer: PPO | Admitting: Internal Medicine

## 2016-10-18 VITALS — BP 120/68 | HR 69 | Temp 99.8°F | Resp 16 | Ht 63.0 in | Wt 280.6 lb

## 2016-10-18 DIAGNOSIS — R059 Cough, unspecified: Secondary | ICD-10-CM

## 2016-10-18 DIAGNOSIS — E7800 Pure hypercholesterolemia, unspecified: Secondary | ICD-10-CM | POA: Diagnosis not present

## 2016-10-18 DIAGNOSIS — E119 Type 2 diabetes mellitus without complications: Secondary | ICD-10-CM

## 2016-10-18 DIAGNOSIS — I4891 Unspecified atrial fibrillation: Secondary | ICD-10-CM

## 2016-10-18 DIAGNOSIS — R05 Cough: Secondary | ICD-10-CM | POA: Diagnosis not present

## 2016-10-18 DIAGNOSIS — I4892 Unspecified atrial flutter: Secondary | ICD-10-CM

## 2016-10-18 DIAGNOSIS — J4520 Mild intermittent asthma, uncomplicated: Secondary | ICD-10-CM | POA: Diagnosis not present

## 2016-10-18 DIAGNOSIS — I10 Essential (primary) hypertension: Secondary | ICD-10-CM

## 2016-10-18 DIAGNOSIS — M1990 Unspecified osteoarthritis, unspecified site: Secondary | ICD-10-CM

## 2016-10-18 DIAGNOSIS — D649 Anemia, unspecified: Secondary | ICD-10-CM

## 2016-10-18 DIAGNOSIS — F325 Major depressive disorder, single episode, in full remission: Secondary | ICD-10-CM | POA: Diagnosis not present

## 2016-10-18 LAB — HEPATIC FUNCTION PANEL
ALT: 13 U/L (ref 0–35)
AST: 9 U/L (ref 0–37)
Albumin: 3.7 g/dL (ref 3.5–5.2)
Alkaline Phosphatase: 65 U/L (ref 39–117)
BILIRUBIN DIRECT: 0.1 mg/dL (ref 0.0–0.3)
BILIRUBIN TOTAL: 0.3 mg/dL (ref 0.2–1.2)
Total Protein: 6.2 g/dL (ref 6.0–8.3)

## 2016-10-18 LAB — BASIC METABOLIC PANEL
BUN: 14 mg/dL (ref 6–23)
CHLORIDE: 102 meq/L (ref 96–112)
CO2: 31 meq/L (ref 19–32)
Calcium: 8.8 mg/dL (ref 8.4–10.5)
Creatinine, Ser: 0.84 mg/dL (ref 0.40–1.20)
GFR: 72.72 mL/min (ref >60.00)
GLUCOSE: 107 mg/dL — AB (ref 70–99)
POTASSIUM: 3.9 meq/L (ref 3.5–5.1)
SODIUM: 141 meq/L (ref 135–145)

## 2016-10-18 LAB — LIPID PANEL
CHOL/HDL RATIO: 3
Cholesterol: 149 mg/dL (ref 0–200)
HDL: 55 mg/dL (ref >39.00)
LDL CALC: 80 mg/dL (ref 0–99)
NONHDL: 93.56
Triglycerides: 69 mg/dL (ref 0.0–149.0)
VLDL: 13.8 mg/dL (ref 0.0–40.0)

## 2016-10-18 LAB — CBC WITH DIFFERENTIAL/PLATELET
BASOS PCT: 0.8 % (ref 0.0–3.0)
Basophils Absolute: 0 10*3/uL (ref 0.0–0.1)
EOS PCT: 3.5 % (ref 0.0–5.0)
Eosinophils Absolute: 0.1 10*3/uL (ref 0.0–0.7)
HEMATOCRIT: 37 % (ref 36.0–46.0)
HEMOGLOBIN: 12.2 g/dL (ref 12.0–15.0)
LYMPHS PCT: 22.3 % (ref 12.0–46.0)
Lymphs Abs: 0.7 10*3/uL (ref 0.7–4.0)
MCHC: 33.1 g/dL (ref 30.0–36.0)
MCV: 79.6 fl (ref 78.0–100.0)
Monocytes Absolute: 0.6 10*3/uL (ref 0.1–1.0)
Monocytes Relative: 18.2 % — ABNORMAL HIGH (ref 3.0–12.0)
NEUTROS ABS: 1.8 10*3/uL (ref 1.4–7.7)
Neutrophils Relative %: 55.2 % (ref 43.0–77.0)
PLATELETS: 199 10*3/uL (ref 150.0–400.0)
RBC: 4.64 Mil/uL (ref 3.87–5.11)
RDW: 15.1 % (ref 11.5–15.5)
WBC: 3.2 10*3/uL — AB (ref 4.0–10.5)

## 2016-10-18 LAB — TSH: TSH: 2.3 u[IU]/mL (ref 0.35–4.50)

## 2016-10-18 LAB — HEMOGLOBIN A1C: Hgb A1c MFr Bld: 6.7 % — ABNORMAL HIGH (ref 4.6–6.5)

## 2016-10-18 MED ORDER — CEFDINIR 300 MG PO CAPS: 300.0000 mg | ORAL_CAPSULE | Freq: Two times a day (BID) | ORAL | 0 refills | Status: DC

## 2016-10-18 MED ORDER — PREDNISONE 10 MG PO TABS: ORAL_TABLET | ORAL | 0 refills | Status: DC

## 2016-10-18 NOTE — Progress Notes (Signed)
Patient ID: Glenda Zavala, female   DOB: February 21, 1951, 66 y.o.   MRN: [id number]   Subjective:    Patient ID: Glenda Zavala, female    DOB: Aug 31, 1950, 65 y.o.   MRN: [id number]  HPI  Patient here for a scheduled follow up.  She reports that starting several days ago, she developed increased drainage.  Is also wheezing and has increased cough.  Cough productive of green mucus.  Tmax 100.8.  Taking tylenol and ibuprofen.  Also taking mucinex/robitussin.  No nausea or vomiting.  No diarrhea now.  Fever broke yesterday pm.  No chest pain.  Some wheezing.  No acid reflux.  No abdominal pain. Has adjusted her diet.  Lost weight.  Feels better.  Handling stress.     Past Medical History:  Diagnosis Date  . Anemia   . Arthritis   . Asthma   . COPD (chronic obstructive pulmonary disease) ([place])   . Depression   . Diabetes mellitus ([place])   . Environmental allergies   . GERD (gastroesophageal reflux disease)   . Heart murmur   . Hypercholesterolemia   . Hypertension   . PSVT (paroxysmal supraventricular tachycardia) ([place])    Past Surgical History:  Procedure Laterality Date  . ABDOMINAL HYSTERECTOMY  1990   secondary to bleeding and hyperplasia  . CESAREAN SECTION    . CHOLECYSTECTOMY N/A 03/30/2016   Procedure: LAPAROSCOPIC CHOLECYSTECTOMY;  Surgeon: Florene Glen, MD;  Location: [facility] ORS;  Service: General;  Laterality: N/A;  . LAPAROSCOPIC GASTRIC BANDING  2010  . TONSILLECTOMY    . TUBAL LIGATION     Family History  Problem Relation Age of Onset  . Hypercholesterolemia Mother   . Emphysema Maternal Grandfather   . Uterine cancer      aunt  . Breast cancer Neg Hx   . Colon cancer Neg Hx    Social History   Social History  . Marital status: Married    Spouse name: N/A  . Number of children: 2  . Years of education: N/A   Occupational History  . teacher    Social History Main Topics  . Smoking status: Never Smoker  . Smokeless tobacco: Never Used  . Alcohol use No    . Drug use: No  . Sexual activity: Not Asked   Other Topics Concern  . None   Social History Narrative  . None    Outpatient Encounter Prescriptions as of 10/18/2016  Medication Sig  . acetaminophen (TYLENOL) 650 MG CR tablet Take 650 mg by mouth every 8 (eight) hours as needed for pain.   Marland Kitchen albuterol (PROVENTIL) (2.5 MG/3ML) 0.083% nebulizer solution Take 3 mLs (2.5 mg total) by nebulization every 6 (six) hours as needed for shortness of breath.  . Blood Glucose Monitoring Suppl (ONETOUCH VERIO IQ SYSTEM) W/DEVICE KIT AS DIRECTED  . citalopram (CELEXA) 40 MG tablet TAKE ONE (1) TABLET BY MOUTH EVERY DAY  . cyanocobalamin (,VITAMIN B-12,) 1000 MCG/ML injection Inject 1,000 mcg into the muscle every 30 (thirty) days.  . fluticasone (FLONASE) 50 MCG/ACT nasal spray USE 2 SPRAYS EACH NOSTRIL ONE TIME DAILY (Patient taking differently: USE 2 SPRAYS EACH NOSTRIL ONE TIME DAILY AS NEEDED FOR ALLERGIES)  . hydrochlorothiazide (HYDRODIURIL) 25 MG tablet TAKE ONE (1) TABLET BY MOUTH EVERY DAY  . Lancets (ACCU-CHEK SOFT TOUCH) lancets Use as instructed  . lisinopril (PRINIVIL,ZESTRIL) 40 MG tablet TAKE ONE TABLET BY MOUTH EVERY MORNING FOR BLOOD PRESSURE  . metFORMIN (GLUCOPHAGE) 1000 MG tablet TAKE  ONE TABLET TWICE DAILY  . montelukast (SINGULAIR) 10 MG tablet Take 10 mg by mouth at bedtime as needed (allergies).   . Multiple Vitamin (MULTIVITAMIN WITH MINERALS) TABS tablet Take 1 tablet by mouth daily.  Marland Kitchen nystatin (MYCOSTATIN) powder APPLY TO AFFECTED AREAS TWICE DAILY (Patient taking differently: APPLY TO AFFECTED AREAS TWICE DAILY AS NEEDED)  . nystatin cream (MYCOSTATIN) APPLY TO AFFECTED AREAS TOPICALLY TWICE A DAY (Patient taking differently: APPLY TO AFFECTED AREAS TOPICALLY TWICE A DAY AS NEEDED)  . ONETOUCH VERIO test strip TEST BLOOD GLUCOSE TWICE DAILY  . rosuvastatin (CRESTOR) 5 MG tablet TAKE ONE TABLET DAILY AT BEDTIME  . tiotropium (SPIRIVA HANDIHALER) 18 MCG inhalation capsule  INHALE CONTENTS OF ONE CAPSULE ONCE A DAY AS DIRECTED  . VENTOLIN HFA 108 (90 Base) MCG/ACT inhaler INHALE TWO PUFFS EVERY 6 HOURS AS NEEDEDWHEEZING OR SHORTNESS OF BREATH  . verapamil (CALAN-SR) 240 MG CR tablet TAKE ONE (1) TABLET BY MOUTH EVERY DAY  . cefdinir (OMNICEF) 300 MG capsule Take 1 capsule (300 mg total) by mouth 2 (two) times daily.  . predniSONE (DELTASONE) 10 MG tablet Take 4 tablets x 1 day and then decrease by 1/2 tablet per day until down to zero mg.   No facility-administered encounter medications on file as of 10/18/2016.     Review of Systems  Constitutional: Negative for appetite change and unexpected weight change.  HENT: Positive for congestion and postnasal drip. Negative for sinus pressure.   Respiratory: Positive for cough and wheezing. Negative for chest tightness and shortness of breath.   Cardiovascular: Negative for chest pain and palpitations.  Gastrointestinal: Negative for abdominal pain, nausea and vomiting.  Genitourinary: Negative for difficulty urinating and dysuria.  Musculoskeletal: Negative for back pain and joint swelling.  Skin: Negative for color change and rash.  Neurological: Negative for dizziness, light-headedness and headaches.  Psychiatric/Behavioral: Negative for agitation and dysphoric mood.       Objective:    Physical Exam  Constitutional: She appears well-developed and well-nourished. No distress.  HENT:  Nose: Nose normal.  Mouth/Throat: Oropharynx is clear and moist.  Neck: Neck supple. No thyromegaly present.  Cardiovascular: Normal rate and regular rhythm.   Pulmonary/Chest: Breath sounds normal. No respiratory distress.  Increased cough with expiration.  Some wheezing.   Abdominal: Soft. Bowel sounds are normal. There is no tenderness.  Musculoskeletal: She exhibits no tenderness.  No increased swelling of lower extremities.    Lymphadenopathy:    She has no cervical adenopathy.  Skin: No rash noted. No erythema.    Psychiatric: She has a normal mood and affect. Her behavior is normal.    BP 120/68 (BP Location: Left Arm, Patient Position: Sitting, Cuff Size: Large)   Pulse 69   Temp 99.8 F (37.7 C) (Oral)   Resp 16   Ht '5\' 3"'  (1.6 m)   Wt 280 lb 9.6 oz (127.3 kg)   SpO2 97%   BMI 49.71 kg/m  Wt Readings from Last 3 Encounters:  10/18/16 280 lb 9.6 oz (127.3 kg)  06/13/16 (!) 309 lb 3.2 oz (140.3 kg)  04/12/16 (!) 303 lb (137.4 kg)     Lab Results  Component Value Date   WBC 3.2 (L) 10/18/2016   HGB 12.2 10/18/2016   HCT 37.0 10/18/2016   PLT 199.0 10/18/2016   GLUCOSE 107 (H) 10/18/2016   CHOL 149 10/18/2016   TRIG 69.0 10/18/2016   HDL 55.00 10/18/2016   LDLCALC 80 10/18/2016   ALT 13 10/18/2016  AST 9 10/18/2016   NA 141 10/18/2016   K 3.9 10/18/2016   CL 102 10/18/2016   CREATININE 0.84 10/18/2016   BUN 14 10/18/2016   CO2 31 10/18/2016   TSH 2.30 10/18/2016   HGBA1C 6.7 (H) 10/18/2016   MICROALBUR <0.7 08/19/2015       Assessment & Plan:   Problem List Items Addressed This Visit    Anemia    Colonoscopy as outlined.  Follow cbc.       Arthritis    Has seen Dr Marry Guan regarding her knees.  Is better since lost weight.  Follow.       Relevant Medications   predniSONE (DELTASONE) 10 MG tablet   Asthma    Has a documented h/o asthma.  With increased cough, congestion and wheezing.  Check cxr.  Treat with omnicef and prednisone taper as directed.  Continue flonase and saline nasal spray.  mucinex as directed.  Inhalers.  Follow.        Relevant Medications   predniSONE (DELTASONE) 10 MG tablet   Atrial fibrillation and flutter (HCC)    In [place].  Sees cardiology.  Stable.        Cough   Relevant Orders   DG Chest 2 View (Completed)   Diabetes mellitus ([place]) - Primary    Has adjusted her diet.  Lost weight.  Low carb diet and exercise.  Follow met b and a1c.        Relevant Orders   Hemoglobin A1c (Completed)   Basic metabolic panel (Completed)    Microalbumin / creatinine urine ratio   Hypercholesterolemia    Low cholesterol diet and exercise.  Continue crestor.  Follow lipid panel and liver function tests.        Relevant Orders   Lipid panel (Completed)   Hepatic function panel (Completed)   Hypertension    Blood pressure under good control.  Continue same medication regimen.  Follow pressures.  Follow metabolic panel.        Relevant Orders   CBC with Differential/Platelet (Completed)   TSH (Completed)   Major depressive disorder with single episode    Stress is better.  On citalopram.  Doing better.  Follow.       Severe obesity (BMI >= 40) (HCC)    Has adjusted her diet.  Lost weight.  Feels better.  Continue diet and exercise.            Einar Pheasant, MD

## 2016-10-18 NOTE — Progress Notes (Signed)
Pre-visit discussion using our clinic review tool. No additional management support is needed unless otherwise documented below in the visit note.  

## 2016-10-19 ENCOUNTER — Other Ambulatory Visit: Payer: Self-pay | Admitting: Internal Medicine

## 2016-10-19 DIAGNOSIS — D72819 Decreased white blood cell count, unspecified: Secondary | ICD-10-CM

## 2016-10-19 NOTE — Progress Notes (Unsigned)
Order placed for f/u cbc.   

## 2016-10-26 ENCOUNTER — Telehealth: Payer: Self-pay

## 2016-10-26 ENCOUNTER — Other Ambulatory Visit: Payer: Self-pay | Admitting: Internal Medicine

## 2016-10-26 ENCOUNTER — Telehealth: Payer: Self-pay | Admitting: Internal Medicine

## 2016-10-26 DIAGNOSIS — R9389 Abnormal findings on diagnostic imaging of other specified body structures: Secondary | ICD-10-CM

## 2016-10-26 MED ORDER — FLUCONAZOLE 150 MG PO TABS: 150.0000 mg | ORAL_TABLET | Freq: Once | ORAL | 0 refills | Status: AC

## 2016-10-26 NOTE — Telephone Encounter (Signed)
I have started auth for ventolin [place] is [id number]

## 2016-10-26 NOTE — Telephone Encounter (Signed)
Spoke to pt.  rx for diflucan sent in.  Feels typical of her yeast infection.  Instructed to hold citalopram temp.

## 2016-10-26 NOTE — Telephone Encounter (Signed)
Pt called about now have develop a yeast infection from the antibiotic that was prescribed.   Pharmacy is [facility], [place]  Call pt @ [phone]. Thank you!

## 2016-10-26 NOTE — Progress Notes (Signed)
Order placed for CT chest.  Pt notified will need to hold metformin.

## 2016-10-26 NOTE — Telephone Encounter (Signed)
  Symptoms: vaginal itching, dysuria, vaginal discharge white color  Duration X 1 day Medications:Omincef, not tried OTC yeast products Usually gets yeast infection when takes antibiotic, Please advise

## 2016-10-27 NOTE — Telephone Encounter (Signed)
approved from 10-26-16 to 08-21-17

## 2016-10-30 ENCOUNTER — Encounter: Payer: Self-pay | Admitting: Internal Medicine

## 2016-10-30 NOTE — Assessment & Plan Note (Signed)
Stress is better.  On citalopram.  Doing better.  Follow.

## 2016-10-30 NOTE — Assessment & Plan Note (Signed)
In [place].  Sees cardiology.  Stable.

## 2016-10-30 NOTE — Assessment & Plan Note (Signed)
Blood pressure under good control.  Continue same medication regimen.  Follow pressures.  Follow metabolic panel.   

## 2016-10-30 NOTE — Assessment & Plan Note (Signed)
Has adjusted her diet.  Lost weight.  Feels better.  Continue diet and exercise.

## 2016-10-30 NOTE — Assessment & Plan Note (Signed)
Has adjusted her diet.  Lost weight.  Low carb diet and exercise.  Follow met b and a1c.

## 2016-10-30 NOTE — Assessment & Plan Note (Signed)
Colonoscopy as outlined.  Follow cbc.   

## 2016-10-30 NOTE — Assessment & Plan Note (Signed)
Low cholesterol diet and exercise.  Continue crestor.  Follow lipid panel and liver function tests.   

## 2016-10-30 NOTE — Assessment & Plan Note (Signed)
Has a documented h/o asthma.  With increased cough, congestion and wheezing.  Check cxr.  Treat with omnicef and prednisone taper as directed.  Continue flonase and saline nasal spray.  mucinex as directed.  Inhalers.  Follow.

## 2016-10-30 NOTE — Assessment & Plan Note (Signed)
Has seen Dr Marry Guan regarding her knees.  Is better since lost weight.  Follow.

## 2016-11-01 ENCOUNTER — Other Ambulatory Visit (INDEPENDENT_AMBULATORY_CARE_PROVIDER_SITE_OTHER): Payer: PPO

## 2016-11-01 DIAGNOSIS — D72819 Decreased white blood cell count, unspecified: Secondary | ICD-10-CM

## 2016-11-01 DIAGNOSIS — E119 Type 2 diabetes mellitus without complications: Secondary | ICD-10-CM | POA: Diagnosis not present

## 2016-11-01 LAB — CBC WITH DIFFERENTIAL/PLATELET
BASOS ABS: 0.1 10*3/uL (ref 0.0–0.1)
BASOS PCT: 0.4 % (ref 0.0–3.0)
EOS ABS: 0.2 10*3/uL (ref 0.0–0.7)
Eosinophils Relative: 1.5 % (ref 0.0–5.0)
HCT: 38.6 % (ref 36.0–46.0)
Hemoglobin: 12.3 g/dL (ref 12.0–15.0)
Lymphocytes Relative: 18.5 % (ref 12.0–46.0)
Lymphs Abs: 2.2 10*3/uL (ref 0.7–4.0)
MCHC: 32 g/dL (ref 30.0–36.0)
MCV: 80 fl (ref 78.0–100.0)
MONO ABS: 0.9 10*3/uL (ref 0.1–1.0)
Monocytes Relative: 7.3 % (ref 3.0–12.0)
Neutro Abs: 8.4 10*3/uL — ABNORMAL HIGH (ref 1.4–7.7)
Neutrophils Relative %: 72.3 % (ref 43.0–77.0)
PLATELETS: 266 10*3/uL (ref 150.0–400.0)
RBC: 4.82 Mil/uL (ref 3.87–5.11)
RDW: 15.4 % (ref 11.5–15.5)
WBC: 11.6 10*3/uL — AB (ref 4.0–10.5)

## 2016-11-01 LAB — MICROALBUMIN / CREATININE URINE RATIO
CREATININE, U: 309.7 mg/dL
MICROALB UR: 2.1 mg/dL — AB (ref 0.0–1.9)
Microalb Creat Ratio: 0.7 mg/g (ref 0.0–30.0)

## 2016-11-02 ENCOUNTER — Other Ambulatory Visit: Payer: Self-pay | Admitting: Internal Medicine

## 2016-11-02 DIAGNOSIS — D72829 Elevated white blood cell count, unspecified: Secondary | ICD-10-CM

## 2016-11-02 NOTE — Progress Notes (Signed)
Orders placed for f/u cbc.

## 2016-11-06 DIAGNOSIS — J449 Chronic obstructive pulmonary disease, unspecified: Secondary | ICD-10-CM | POA: Diagnosis not present

## 2016-11-14 ENCOUNTER — Other Ambulatory Visit: Payer: Self-pay | Admitting: Internal Medicine

## 2016-11-15 ENCOUNTER — Ambulatory Visit: Payer: PPO

## 2016-11-23 ENCOUNTER — Other Ambulatory Visit: Payer: Self-pay | Admitting: Internal Medicine

## 2016-11-23 ENCOUNTER — Other Ambulatory Visit (INDEPENDENT_AMBULATORY_CARE_PROVIDER_SITE_OTHER): Payer: PPO

## 2016-11-23 DIAGNOSIS — D649 Anemia, unspecified: Secondary | ICD-10-CM

## 2016-11-23 DIAGNOSIS — D72829 Elevated white blood cell count, unspecified: Secondary | ICD-10-CM | POA: Diagnosis not present

## 2016-11-23 LAB — CBC WITH DIFFERENTIAL/PLATELET
BASOS ABS: 0 10*3/uL (ref 0.0–0.1)
Basophils Relative: 0.4 % (ref 0.0–3.0)
EOS PCT: 1.1 % (ref 0.0–5.0)
Eosinophils Absolute: 0.1 10*3/uL (ref 0.0–0.7)
HCT: 34.5 % — ABNORMAL LOW (ref 36.0–46.0)
Hemoglobin: 11.4 g/dL — ABNORMAL LOW (ref 12.0–15.0)
LYMPHS ABS: 2.3 10*3/uL (ref 0.7–4.0)
Lymphocytes Relative: 23.7 % (ref 12.0–46.0)
MCHC: 33 g/dL (ref 30.0–36.0)
MCV: 79.9 fl (ref 78.0–100.0)
MONO ABS: 0.7 10*3/uL (ref 0.1–1.0)
MONOS PCT: 7.2 % (ref 3.0–12.0)
NEUTROS ABS: 6.6 10*3/uL (ref 1.4–7.7)
NEUTROS PCT: 67.6 % (ref 43.0–77.0)
PLATELETS: 308 10*3/uL (ref 150.0–400.0)
RBC: 4.32 Mil/uL (ref 3.87–5.11)
RDW: 15.6 % — ABNORMAL HIGH (ref 11.5–15.5)
WBC: 9.8 10*3/uL (ref 4.0–10.5)

## 2016-11-23 NOTE — Progress Notes (Signed)
Orders placed for f/u labs.  

## 2016-11-30 ENCOUNTER — Ambulatory Visit
Admission: RE | Admit: 2016-11-30 | Discharge: 2016-11-30 | Disposition: A | Discharge status: Home / Self Care | Payer: PPO | Source: Ambulatory Visit | Attending: Internal Medicine | Admitting: Internal Medicine

## 2016-11-30 DIAGNOSIS — R938 Abnormal findings on diagnostic imaging of other specified body structures: Secondary | ICD-10-CM | POA: Diagnosis not present

## 2016-11-30 DIAGNOSIS — R911 Solitary pulmonary nodule: Secondary | ICD-10-CM | POA: Diagnosis not present

## 2016-11-30 DIAGNOSIS — R9389 Abnormal findings on diagnostic imaging of other specified body structures: Secondary | ICD-10-CM

## 2016-11-30 LAB — POCT I-STAT CREATININE: Creatinine, Ser: 0.8 mg/dL (ref 0.44–1.00)

## 2016-11-30 MED ORDER — IOPAMIDOL (ISOVUE-300) INJECTION 61%
75.0000 mL | Freq: Once | INTRAVENOUS | Status: AC | PRN
  Administered 2016-11-30: 75 mL via INTRAVENOUS

## 2016-12-02 DIAGNOSIS — L821 Other seborrheic keratosis: Secondary | ICD-10-CM | POA: Diagnosis not present

## 2016-12-02 DIAGNOSIS — D2272 Melanocytic nevi of left lower limb, including hip: Secondary | ICD-10-CM | POA: Diagnosis not present

## 2016-12-02 DIAGNOSIS — D2261 Melanocytic nevi of right upper limb, including shoulder: Secondary | ICD-10-CM | POA: Diagnosis not present

## 2016-12-02 DIAGNOSIS — D225 Melanocytic nevi of trunk: Secondary | ICD-10-CM | POA: Diagnosis not present

## 2016-12-07 DIAGNOSIS — J449 Chronic obstructive pulmonary disease, unspecified: Secondary | ICD-10-CM | POA: Diagnosis not present

## 2016-12-09 ENCOUNTER — Ambulatory Visit: Payer: Self-pay

## 2016-12-14 ENCOUNTER — Other Ambulatory Visit: Payer: Self-pay | Admitting: Unknown Physician Specialty

## 2016-12-14 ENCOUNTER — Other Ambulatory Visit: Payer: Self-pay | Admitting: Internal Medicine

## 2016-12-14 ENCOUNTER — Ambulatory Visit
Admission: RE | Admit: 2016-12-14 | Discharge: 2016-12-14 | Disposition: A | Discharge status: Home / Self Care | Payer: PPO | Source: Ambulatory Visit | Attending: Unknown Physician Specialty | Admitting: Unknown Physician Specialty

## 2016-12-14 DIAGNOSIS — I2510 Atherosclerotic heart disease of native coronary artery without angina pectoris: Secondary | ICD-10-CM | POA: Diagnosis not present

## 2016-12-14 DIAGNOSIS — I700 Atherosclerosis of aorta: Secondary | ICD-10-CM | POA: Diagnosis not present

## 2016-12-14 DIAGNOSIS — Z9884 Bariatric surgery status: Secondary | ICD-10-CM | POA: Insufficient documentation

## 2016-12-14 DIAGNOSIS — K449 Diaphragmatic hernia without obstruction or gangrene: Secondary | ICD-10-CM | POA: Diagnosis not present

## 2016-12-14 DIAGNOSIS — R1013 Epigastric pain: Secondary | ICD-10-CM | POA: Diagnosis not present

## 2016-12-14 LAB — POCT I-STAT CREATININE: CREATININE: 0.9 mg/dL (ref 0.44–1.00)

## 2016-12-14 MED ORDER — IOPAMIDOL (ISOVUE-300) INJECTION 61%
100.0000 mL | Freq: Once | INTRAVENOUS | Status: AC | PRN
  Administered 2016-12-14: 100 mL via INTRAVENOUS

## 2016-12-15 ENCOUNTER — Telehealth: Payer: Self-pay | Admitting: *Deleted

## 2016-12-15 DIAGNOSIS — R109 Unspecified abdominal pain: Secondary | ICD-10-CM

## 2016-12-15 NOTE — Telephone Encounter (Signed)
Patient was seen at [facility] walk in  for stomach pain. She has requested to have a referral to a gastrologist. Patient had a cat scan, that resulted to  acid reflux and a hernia. She continues to have severe pain. Pt contact [phone]

## 2016-12-15 NOTE — Telephone Encounter (Signed)
Please advise 

## 2016-12-16 NOTE — Telephone Encounter (Signed)
She does not have preference with who she is sent to. She is feeling a little better but is still having pain

## 2016-12-16 NOTE — Telephone Encounter (Signed)
I do not mind referring her to a gastroenterologist.  Does she have a preference of which one she sees.  I reviewed acute care note and CT.  Is she feeling better?

## 2016-12-18 NOTE — Telephone Encounter (Signed)
Order placed for GI referral.   

## 2016-12-28 ENCOUNTER — Other Ambulatory Visit: Payer: Self-pay | Admitting: Internal Medicine

## 2017-01-06 DIAGNOSIS — J449 Chronic obstructive pulmonary disease, unspecified: Secondary | ICD-10-CM | POA: Diagnosis not present

## 2017-01-24 DIAGNOSIS — R1013 Epigastric pain: Secondary | ICD-10-CM | POA: Diagnosis not present

## 2017-02-06 DIAGNOSIS — J449 Chronic obstructive pulmonary disease, unspecified: Secondary | ICD-10-CM | POA: Diagnosis not present

## 2017-02-14 ENCOUNTER — Other Ambulatory Visit (INDEPENDENT_AMBULATORY_CARE_PROVIDER_SITE_OTHER): Payer: PPO

## 2017-02-14 DIAGNOSIS — D649 Anemia, unspecified: Secondary | ICD-10-CM | POA: Diagnosis not present

## 2017-02-14 LAB — CBC WITH DIFFERENTIAL/PLATELET
BASOS PCT: 0.8 % (ref 0.0–3.0)
Basophils Absolute: 0.1 10*3/uL (ref 0.0–0.1)
EOS PCT: 2.6 % (ref 0.0–5.0)
Eosinophils Absolute: 0.2 10*3/uL (ref 0.0–0.7)
HCT: 34.9 % — ABNORMAL LOW (ref 36.0–46.0)
Hemoglobin: 11.3 g/dL — ABNORMAL LOW (ref 12.0–15.0)
LYMPHS ABS: 1.9 10*3/uL (ref 0.7–4.0)
Lymphocytes Relative: 25 % (ref 12.0–46.0)
MCHC: 32.5 g/dL (ref 30.0–36.0)
MCV: 81.5 fl (ref 78.0–100.0)
MONOS PCT: 6.7 % (ref 3.0–12.0)
Monocytes Absolute: 0.5 10*3/uL (ref 0.1–1.0)
NEUTROS ABS: 4.9 10*3/uL (ref 1.4–7.7)
NEUTROS PCT: 64.9 % (ref 43.0–77.0)
PLATELETS: 289 10*3/uL (ref 150.0–400.0)
RBC: 4.29 Mil/uL (ref 3.87–5.11)
RDW: 13.9 % (ref 11.5–15.5)
WBC: 7.5 10*3/uL (ref 4.0–10.5)

## 2017-02-14 LAB — VITAMIN B12: Vitamin B-12: 150 pg/mL — ABNORMAL LOW (ref 211–911)

## 2017-02-14 LAB — FERRITIN: Ferritin: 16.9 ng/mL (ref 10.0–291.0)

## 2017-02-17 ENCOUNTER — Ambulatory Visit: Payer: Self-pay | Admitting: Internal Medicine

## 2017-02-17 MED ORDER — "SYRINGE 25G X 1"" 3 ML MISC": 1.0000 "application " | 0 refills | Status: DC

## 2017-02-17 MED ORDER — CYANOCOBALAMIN 1000 MCG/ML IJ SOLN: 1000.0000 ug | INTRAMUSCULAR | 3 refills | Status: DC

## 2017-02-17 NOTE — Addendum Note (Signed)
Addended by: Francella Solian on: 02/17/2017 09:25 AM   Modules accepted: Orders

## 2017-03-14 ENCOUNTER — Encounter: Payer: Self-pay | Admitting: Internal Medicine

## 2017-03-14 ENCOUNTER — Ambulatory Visit (INDEPENDENT_AMBULATORY_CARE_PROVIDER_SITE_OTHER): Payer: PPO | Admitting: Internal Medicine

## 2017-03-14 VITALS — BP 120/68 | HR 64 | Temp 98.6°F | Resp 12 | Ht 63.0 in | Wt 270.4 lb

## 2017-03-14 DIAGNOSIS — I4891 Unspecified atrial fibrillation: Secondary | ICD-10-CM | POA: Diagnosis not present

## 2017-03-14 DIAGNOSIS — K219 Gastro-esophageal reflux disease without esophagitis: Secondary | ICD-10-CM | POA: Diagnosis not present

## 2017-03-14 DIAGNOSIS — E119 Type 2 diabetes mellitus without complications: Secondary | ICD-10-CM

## 2017-03-14 DIAGNOSIS — R1310 Dysphagia, unspecified: Secondary | ICD-10-CM

## 2017-03-14 DIAGNOSIS — Z9884 Bariatric surgery status: Secondary | ICD-10-CM

## 2017-03-14 DIAGNOSIS — I4892 Unspecified atrial flutter: Secondary | ICD-10-CM | POA: Diagnosis not present

## 2017-03-14 DIAGNOSIS — D649 Anemia, unspecified: Secondary | ICD-10-CM | POA: Diagnosis not present

## 2017-03-14 DIAGNOSIS — E7800 Pure hypercholesterolemia, unspecified: Secondary | ICD-10-CM | POA: Diagnosis not present

## 2017-03-14 DIAGNOSIS — J4520 Mild intermittent asthma, uncomplicated: Secondary | ICD-10-CM

## 2017-03-14 DIAGNOSIS — F325 Major depressive disorder, single episode, in full remission: Secondary | ICD-10-CM

## 2017-03-14 DIAGNOSIS — I10 Essential (primary) hypertension: Secondary | ICD-10-CM | POA: Diagnosis not present

## 2017-03-14 MED ORDER — TRIAMCINOLONE ACETONIDE 0.1 % EX CREA: 1.0000 "application " | TOPICAL_CREAM | Freq: Two times a day (BID) | CUTANEOUS | 0 refills | Status: AC

## 2017-03-14 NOTE — Progress Notes (Signed)
Pre-visit discussion using our clinic review tool. No additional management support is needed unless otherwise documented below in the visit note.  

## 2017-03-14 NOTE — Progress Notes (Signed)
Patient ID: SANTITA HUNSBERGER, female   DOB: [birth year], 66 y.o.   MRN: [id number]   Subjective:    Patient ID: HAJAR PENNINGER, female    DOB: Dec 30, 1950, 66 y.o.   MRN: [id number]  HPI  Patient here for a scheduled follow up.  She reports increased stress recently with some family health issues and relationship issues.  Discussed with her today.  She does not feel she needs anything more at this time.  She has adjusted her diet.  Lost weight.  Feels physically better.  Breathing doing well.  No chest pain.  Was evaluated by GI for epigastric pain.  On prilosec.  Was placed on carafate.  Planning for EGD 05/2017.  Still with some issues.  Food hangs up at times.  Has to eat slowly.  Discussed f/u with her surgeon that performed her lap band.     Past Medical History:  Diagnosis Date  . Anemia   . Arthritis   . COPD (chronic obstructive pulmonary disease) ([place])   . Depression   . Diabetes mellitus ([place])   . Environmental allergies   . GERD (gastroesophageal reflux disease)   . Heart murmur   . Hypercholesterolemia   . Hypertension   . PSVT (paroxysmal supraventricular tachycardia) ([place])    Past Surgical History:  Procedure Laterality Date  . ABDOMINAL HYSTERECTOMY  1990   secondary to bleeding and hyperplasia  . CESAREAN SECTION    . CHOLECYSTECTOMY N/A 03/30/2016   Procedure: LAPAROSCOPIC CHOLECYSTECTOMY;  Surgeon: Florene Glen, MD;  Location: [facility] ORS;  Service: General;  Laterality: N/A;  . LAPAROSCOPIC GASTRIC BANDING  2010  . TONSILLECTOMY    . TUBAL LIGATION     Family History  Problem Relation Age of Onset  . Hypercholesterolemia Mother   . Emphysema Maternal Grandfather   . Uterine cancer Unknown        aunt  . Breast cancer Neg Hx   . Colon cancer Neg Hx    Social History   Social History  . Marital status: Married    Spouse name: N/A  . Number of children: 2  . Years of education: N/A   Occupational History  . teacher    Social History Main Topics  .  Smoking status: Never Smoker  . Smokeless tobacco: Never Used  . Alcohol use No  . Drug use: No  . Sexual activity: Not Asked   Other Topics Concern  . None   Social History Narrative  . None    Outpatient Encounter Prescriptions as of 03/14/2017  Medication Sig  . acetaminophen (TYLENOL) 650 MG CR tablet Take 650 mg by mouth every 8 (eight) hours as needed for pain.   Marland Kitchen albuterol (PROVENTIL) (2.5 MG/3ML) 0.083% nebulizer solution Take 3 mLs (2.5 mg total) by nebulization every 6 (six) hours as needed for shortness of breath.  . Blood Glucose Monitoring Suppl (ONETOUCH VERIO IQ SYSTEM) W/DEVICE KIT AS DIRECTED  . citalopram (CELEXA) 40 MG tablet TAKE ONE (1) TABLET BY MOUTH EVERY DAY  . cyanocobalamin (,VITAMIN B-12,) 1000 MCG/ML injection Inject 1 mL (1,000 mcg total) into the muscle once a week. Once a week for four weeks and once a month after  . fluticasone (FLONASE) 50 MCG/ACT nasal spray USE 2 SPRAYS EACH NOSTRIL ONE TIME DAILY (Patient taking differently: USE 2 SPRAYS EACH NOSTRIL ONE TIME DAILY AS NEEDED FOR ALLERGIES)  . hydrochlorothiazide (HYDRODIURIL) 25 MG tablet TAKE ONE (1) TABLET BY MOUTH EVERY DAY  . Lancets (ACCU-CHEK  SOFT TOUCH) lancets Use as instructed  . lisinopril (PRINIVIL,ZESTRIL) 40 MG tablet TAKE ONE TABLET BY MOUTH EVERY MORNING FOR BLOOD PRESSURE  . metFORMIN (GLUCOPHAGE) 1000 MG tablet TAKE ONE TABLET TWICE DAILY  . montelukast (SINGULAIR) 10 MG tablet Take 10 mg by mouth at bedtime as needed (allergies).   . Multiple Vitamin (MULTIVITAMIN WITH MINERALS) TABS tablet Take 1 tablet by mouth daily.  Marland Kitchen nystatin (MYCOSTATIN) powder APPLY TO AFFECTED AREAS TWICE DAILY (Patient taking differently: APPLY TO AFFECTED AREAS TWICE DAILY AS NEEDED)  . nystatin cream (MYCOSTATIN) APPLY TO AFFECTED AREAS TOPICALLY TWICE A DAY (Patient taking differently: APPLY TO AFFECTED AREAS TOPICALLY TWICE A DAY AS NEEDED)  . ONETOUCH VERIO test strip TEST BLOOD GLUCOSE TWICE DAILY  .  rosuvastatin (CRESTOR) 5 MG tablet TAKE ONE TABLET DAILY AT BEDTIME  . Syringe/Needle, Disp, (SYRINGE 3CC/25GX1") 25G X 1" 3 ML MISC 1 application by Does not apply route once a week.  . tiotropium (SPIRIVA HANDIHALER) 18 MCG inhalation capsule INHALE CONTENTS OF ONE CAPSULE ONCE A DAY AS DIRECTED  . VENTOLIN HFA 108 (90 Base) MCG/ACT inhaler INHALE TWO PUFFS EVERY 6 HOURS AS NEEDEDWHEEZING OR SHORTNESS OF BREATH  . verapamil (CALAN-SR) 240 MG CR tablet TAKE ONE (1) TABLET BY MOUTH EVERY DAY  . triamcinolone cream (KENALOG) 0.1 % Apply 1 application topically 2 (two) times daily.  . [DISCONTINUED] cefdinir (OMNICEF) 300 MG capsule Take 1 capsule (300 mg total) by mouth 2 (two) times daily.  . [DISCONTINUED] predniSONE (DELTASONE) 10 MG tablet Take 4 tablets x 1 day and then decrease by 1/2 tablet per day until down to zero mg.   No facility-administered encounter medications on file as of 03/14/2017.     Review of Systems  Constitutional:       Has adjusted her diet.  Lost weight.    HENT: Negative for congestion and sinus pressure.   Respiratory: Negative for cough, chest tightness and shortness of breath.   Cardiovascular: Negative for chest pain and palpitations.  Gastrointestinal: Negative for abdominal pain and nausea.       Food hangs up at times.  Still some issues.   Genitourinary: Negative for difficulty urinating and dysuria.  Musculoskeletal: Negative for myalgias and neck pain.  Skin: Negative for color change and rash.  Neurological: Negative for dizziness, light-headedness and headaches.  Psychiatric/Behavioral: Negative for agitation and dysphoric mood.       Objective:    Physical Exam  Constitutional: She appears well-developed and well-nourished. No distress.  HENT:  Nose: Nose normal.  Mouth/Throat: Oropharynx is clear and moist.  Neck: Neck supple. No thyromegaly present.  Cardiovascular: Normal rate and regular rhythm.   Pulmonary/Chest: Breath sounds normal.  No respiratory distress. She has no wheezes.  Abdominal: Soft. Bowel sounds are normal. There is no tenderness.  Musculoskeletal: She exhibits no tenderness.  No increased edema.   Lymphadenopathy:    She has no cervical adenopathy.  Skin: No rash noted. No erythema.  Psychiatric: She has a normal mood and affect. Her behavior is normal.    BP 120/68 (BP Location: Left Arm, Patient Position: Sitting, Cuff Size: Normal)   Pulse 64   Temp 98.6 F (37 C) (Oral)   Resp 12   Ht _0  (1.6 m)   Wt 270 lb 6.4 oz (122.7 kg)   SpO2 95%   BMI 47.90 kg/m  Wt Readings from Last 3 Encounters:  03/14/17 270 lb 6.4 oz (122.7 kg)  10/18/16 280 lb 9.6 oz (  127.3 kg)  06/13/16 (!) 309 lb 3.2 oz (140.3 kg)     Lab Results  Component Value Date   WBC 7.5 02/14/2017   HGB 11.3 (L) 02/14/2017   HCT 34.9 (L) 02/14/2017   PLT 289.0 02/14/2017   GLUCOSE 107 (H) 10/18/2016   CHOL 149 10/18/2016   TRIG 69.0 10/18/2016   HDL 55.00 10/18/2016   LDLCALC 80 10/18/2016   ALT 13 10/18/2016   AST 9 10/18/2016   NA 141 10/18/2016   K 3.9 10/18/2016   CL 102 10/18/2016   CREATININE 0.90 12/14/2016   BUN 14 10/18/2016   CO2 31 10/18/2016   TSH 2.30 10/18/2016   HGBA1C 6.7 (H) 10/18/2016   MICROALBUR 2.1 (H) 11/01/2016    Ct Abdomen Pelvis W Contrast  Result Date: 12/14/2016 CLINICAL DATA:  Epigastric pain since 1 a.m. Feeling similar to prior cholecystitis. Hysterectomy and cholecystectomy in August. EXAM: CT ABDOMEN AND PELVIS WITH CONTRAST TECHNIQUE: Multidetector CT imaging of the abdomen and pelvis was performed using the standard protocol following bolus administration of intravenous contrast. CONTRAST:  150m ISOVUE-300 IOPAMIDOL (ISOVUE-300) INJECTION 61% COMPARISON:  03/08/2016 FINDINGS: Lower chest: Bibasilar atelectasis. A 1 mm left lower lobe pulmonary nodule is similar on image 14/series 4, most likely a benign subpleural lymph node. Bibasilar atelectasis or scar. Mild cardiomegaly with  right coronary artery atherosclerosis. Tiny hiatal hernia. Hepatobiliary: Normal liver. Cholecystectomy, without biliary ductal dilatation. Pancreas: Normal, without mass or ductal dilatation. Spleen: Normal in size, without focal abnormality. Adrenals/Urinary Tract: Normal adrenal glands. Interpolar right renal too small to characterize lesion. Normal left kidney, without hydronephrosis. Normal urinary bladder. Stomach/Bowel: Status post lap band, properly positioned. The gastric body is underdistended and not well evaluated. Scattered colonic diverticula. Normal terminal ileum. Appendix is not visualized but there is no evidence of right lower quadrant inflammation. Normal small bowel. Vascular/Lymphatic: Aortic and branch vessel atherosclerosis. No abdominopelvic adenopathy. Reproductive: Hysterectomy.  No adnexal mass. Other: No significant free fluid. Musculoskeletal: Lumbosacral spondylosis. Grade 1 L5-S1 anterolisthesis. IMPRESSION: 1.  No acute process in the abdomen or pelvis. 2. Small hiatal hernia. Status post lap band procedure, without acute complication. 3. Age advanced coronary artery atherosclerosis. Recommend assessment of coronary risk factors and consideration of medical therapy. 4.  Aortic atherosclerosis. Electronically Signed   By: KAbigail MiyamotoM.D.   On: 12/14/2016 14:26       Assessment & Plan:   Problem List Items Addressed This Visit    Anemia    hgb 11.3 - stable.        Relevant Orders   CBC with Differential/Platelet   Ferritin   Asthma    Breathing stable.        Atrial fibrillation and flutter (HCC)    In [place].  Sees cardiology.  Stable.       [place]    Has adjusted her diet.  Lost weight.  Follow met b and a1c.  Low carb diet and exercise.        Relevant Orders   Hemoglobin A1c   GERD (gastroesophageal reflux disease)    On prilosec and carafate.  Planning for EGD.       Relevant Orders   Ambulatory referral to General Surgery   Hx of  laparoscopic gastric banding    Problems with swallowing at times.  Food hangs up.  Get her back in with her surgeon for evaluation.  On carafate and prilosec.        Relevant Orders  Ambulatory referral to General Surgery   Hypercholesterolemia    On crestor.  Low cholesterol diet and exercise.  Follow lipid panel and liver function tests.        Relevant Orders   Hepatic function panel   Lipid panel   Hypertension    Blood pressure under good control.  Continue same medication regimen.  Follow pressures.  Follow metabolic panel.        Relevant Orders   Basic metabolic panel   Major depressive disorder with single episode    Increased stress as outlined.  On citalopram.  Does not feel needs anything more at this time.  Follow.        Severe obesity (BMI >= 40) (HCC)    Has adjusted her diet.  Lost weight.  Continue diet and exercise.  Follow.        Other Visit Diagnoses    Dysphagia, unspecified type    -  Primary   Relevant Orders   Ambulatory referral to General Surgery       Einar Pheasant, MD

## 2017-03-17 ENCOUNTER — Encounter: Payer: Self-pay | Admitting: Internal Medicine

## 2017-03-17 NOTE — Assessment & Plan Note (Signed)
On crestor.  Low cholesterol diet and exercise.  Follow lipid panel and liver function tests.   

## 2017-03-17 NOTE — Assessment & Plan Note (Signed)
Has adjusted her diet.  Lost weight.  Continue diet and exercise.  Follow.

## 2017-03-17 NOTE — Assessment & Plan Note (Signed)
Increased stress as outlined.  On citalopram.  Does not feel needs anything more at this time.  Follow.   

## 2017-03-17 NOTE — Assessment & Plan Note (Signed)
In [place].  Sees cardiology.  Stable.

## 2017-03-17 NOTE — Assessment & Plan Note (Signed)
Breathing stable.

## 2017-03-17 NOTE — Assessment & Plan Note (Addendum)
On prilosec and carafate.  Planning for EGD.

## 2017-03-17 NOTE — Assessment & Plan Note (Signed)
hgb 11.3 - stable.

## 2017-03-17 NOTE — Assessment & Plan Note (Signed)
Problems with swallowing at times.  Food hangs up.  Get her back in with her surgeon for evaluation.  On carafate and prilosec.

## 2017-03-17 NOTE — Assessment & Plan Note (Signed)
Has adjusted her diet.  Lost weight.  Follow met b and a1c.  Low carb diet and exercise.

## 2017-03-17 NOTE — Assessment & Plan Note (Signed)
Blood pressure under good control.  Continue same medication regimen.  Follow pressures.  Follow metabolic panel.   

## 2017-03-20 ENCOUNTER — Other Ambulatory Visit: Payer: Self-pay | Admitting: Internal Medicine

## 2017-04-11 ENCOUNTER — Other Ambulatory Visit: Payer: Self-pay | Admitting: Internal Medicine

## 2017-04-26 ENCOUNTER — Telehealth: Payer: Self-pay | Admitting: Internal Medicine

## 2017-04-26 NOTE — Telephone Encounter (Signed)
Left pt message asking to call Allison back directly at [phone] to schedule AWV. Thanks! °  °*NOTE* No hx of AWV °

## 2017-05-10 DIAGNOSIS — E7800 Pure hypercholesterolemia, unspecified: Secondary | ICD-10-CM | POA: Diagnosis not present

## 2017-05-10 DIAGNOSIS — I480 Paroxysmal atrial fibrillation: Secondary | ICD-10-CM | POA: Diagnosis not present

## 2017-05-10 DIAGNOSIS — I10 Essential (primary) hypertension: Secondary | ICD-10-CM | POA: Diagnosis not present

## 2017-05-15 ENCOUNTER — Other Ambulatory Visit (INDEPENDENT_AMBULATORY_CARE_PROVIDER_SITE_OTHER): Payer: PPO

## 2017-05-15 DIAGNOSIS — I10 Essential (primary) hypertension: Secondary | ICD-10-CM | POA: Diagnosis not present

## 2017-05-15 DIAGNOSIS — E7800 Pure hypercholesterolemia, unspecified: Secondary | ICD-10-CM

## 2017-05-15 DIAGNOSIS — D649 Anemia, unspecified: Secondary | ICD-10-CM | POA: Diagnosis not present

## 2017-05-15 DIAGNOSIS — E119 Type 2 diabetes mellitus without complications: Secondary | ICD-10-CM

## 2017-05-15 LAB — CBC WITH DIFFERENTIAL/PLATELET
BASOS ABS: 0.1 10*3/uL (ref 0.0–0.1)
BASOS PCT: 0.7 % (ref 0.0–3.0)
EOS PCT: 3 % (ref 0.0–5.0)
Eosinophils Absolute: 0.2 10*3/uL (ref 0.0–0.7)
HEMATOCRIT: 38 % (ref 36.0–46.0)
Hemoglobin: 12.3 g/dL (ref 12.0–15.0)
LYMPHS ABS: 1.9 10*3/uL (ref 0.7–4.0)
Lymphocytes Relative: 26.3 % (ref 12.0–46.0)
MCHC: 32.5 g/dL (ref 30.0–36.0)
MCV: 81.6 fl (ref 78.0–100.0)
MONOS PCT: 7 % (ref 3.0–12.0)
Monocytes Absolute: 0.5 10*3/uL (ref 0.1–1.0)
NEUTROS ABS: 4.6 10*3/uL (ref 1.4–7.7)
NEUTROS PCT: 63 % (ref 43.0–77.0)
PLATELETS: 269 10*3/uL (ref 150.0–400.0)
RBC: 4.66 Mil/uL (ref 3.87–5.11)
RDW: 15.4 % (ref 11.5–15.5)
WBC: 7.3 10*3/uL (ref 4.0–10.5)

## 2017-05-15 LAB — LIPID PANEL
CHOLESTEROL: 164 mg/dL (ref 0–200)
HDL: 56.2 mg/dL (ref >39.00)
LDL Cholesterol: 90 mg/dL (ref 0–99)
NonHDL: 107.63
TRIGLYCERIDES: 90 mg/dL (ref 0.0–149.0)
Total CHOL/HDL Ratio: 3
VLDL: 18 mg/dL (ref 0.0–40.0)

## 2017-05-15 LAB — HEPATIC FUNCTION PANEL
ALBUMIN: 3.9 g/dL (ref 3.5–5.2)
ALT: 8 U/L (ref 0–35)
AST: 6 U/L (ref 0–37)
Alkaline Phosphatase: 65 U/L (ref 39–117)
Bilirubin, Direct: 0.1 mg/dL (ref 0.0–0.3)
TOTAL PROTEIN: 6.3 g/dL (ref 6.0–8.3)
Total Bilirubin: 0.5 mg/dL (ref 0.2–1.2)

## 2017-05-15 LAB — FERRITIN: Ferritin: 17.5 ng/mL (ref 10.0–291.0)

## 2017-05-15 LAB — BASIC METABOLIC PANEL
BUN: 18 mg/dL (ref 6–23)
CHLORIDE: 103 meq/L (ref 96–112)
CO2: 30 mEq/L (ref 19–32)
Calcium: 9.3 mg/dL (ref 8.4–10.5)
Creatinine, Ser: 0.78 mg/dL (ref 0.40–1.20)
GFR: 79.07 mL/min (ref >60.00)
GLUCOSE: 99 mg/dL (ref 70–99)
POTASSIUM: 3.8 meq/L (ref 3.5–5.1)
SODIUM: 142 meq/L (ref 135–145)

## 2017-05-15 LAB — HEMOGLOBIN A1C: Hgb A1c MFr Bld: 6.1 % (ref 4.6–6.5)

## 2017-05-16 ENCOUNTER — Encounter: Payer: Self-pay | Admitting: Internal Medicine

## 2017-05-24 DIAGNOSIS — Z9884 Bariatric surgery status: Secondary | ICD-10-CM | POA: Diagnosis not present

## 2017-05-24 DIAGNOSIS — K219 Gastro-esophageal reflux disease without esophagitis: Secondary | ICD-10-CM | POA: Diagnosis not present

## 2017-05-24 DIAGNOSIS — R634 Abnormal weight loss: Secondary | ICD-10-CM | POA: Diagnosis not present

## 2017-05-24 DIAGNOSIS — K5730 Diverticulosis of large intestine without perforation or abscess without bleeding: Secondary | ICD-10-CM | POA: Diagnosis not present

## 2017-05-24 DIAGNOSIS — R1310 Dysphagia, unspecified: Secondary | ICD-10-CM | POA: Diagnosis not present

## 2017-05-24 DIAGNOSIS — Z6841 Body Mass Index (BMI) 40.0 and over, adult: Secondary | ICD-10-CM | POA: Diagnosis not present

## 2017-05-25 ENCOUNTER — Other Ambulatory Visit: Payer: Self-pay | Admitting: Internal Medicine

## 2017-05-25 NOTE — Telephone Encounter (Signed)
Do you still want patient to take weekly?

## 2017-05-26 ENCOUNTER — Other Ambulatory Visit: Payer: Self-pay | Admitting: Internal Medicine

## 2017-05-26 DIAGNOSIS — Z1231 Encounter for screening mammogram for malignant neoplasm of breast: Secondary | ICD-10-CM

## 2017-05-26 NOTE — Telephone Encounter (Signed)
rx ok'd for monthly B12. Does not need q week now.

## 2017-06-01 ENCOUNTER — Ambulatory Visit
Admission: RE | Admit: 2017-06-01 | Discharge: 2017-06-01 | Disposition: A | Discharge status: Home / Self Care | Payer: PPO | Source: Ambulatory Visit | Attending: Internal Medicine | Admitting: Internal Medicine

## 2017-06-01 DIAGNOSIS — Z1231 Encounter for screening mammogram for malignant neoplasm of breast: Secondary | ICD-10-CM | POA: Diagnosis not present

## 2017-06-05 NOTE — Telephone Encounter (Signed)
Scheduled 08/04/17

## 2017-06-06 ENCOUNTER — Encounter: Admission: RE | Payer: Self-pay | Source: Ambulatory Visit

## 2017-06-06 ENCOUNTER — Ambulatory Visit: Admission: RE | Admit: 2017-06-06 | Payer: PPO | Source: Ambulatory Visit | Admitting: Gastroenterology

## 2017-06-06 SURGERY — ESOPHAGOGASTRODUODENOSCOPY (EGD) WITH PROPOFOL
Anesthesia: General

## 2017-06-07 LAB — HM DIABETES EYE EXAM

## 2017-06-15 ENCOUNTER — Ambulatory Visit (INDEPENDENT_AMBULATORY_CARE_PROVIDER_SITE_OTHER): Payer: PPO | Admitting: Internal Medicine

## 2017-06-15 ENCOUNTER — Encounter: Payer: Self-pay | Admitting: Internal Medicine

## 2017-06-15 ENCOUNTER — Telehealth: Payer: Self-pay

## 2017-06-15 ENCOUNTER — Telehealth: Payer: Self-pay | Admitting: Internal Medicine

## 2017-06-15 VITALS — BP 124/76 | HR 64 | Temp 98.3°F | Resp 16 | Ht 62.99 in | Wt 264.2 lb

## 2017-06-15 DIAGNOSIS — Z9884 Bariatric surgery status: Secondary | ICD-10-CM

## 2017-06-15 DIAGNOSIS — D649 Anemia, unspecified: Secondary | ICD-10-CM

## 2017-06-15 DIAGNOSIS — I4892 Unspecified atrial flutter: Secondary | ICD-10-CM

## 2017-06-15 DIAGNOSIS — I10 Essential (primary) hypertension: Secondary | ICD-10-CM | POA: Diagnosis not present

## 2017-06-15 DIAGNOSIS — I4891 Unspecified atrial fibrillation: Secondary | ICD-10-CM

## 2017-06-15 DIAGNOSIS — Z0000 Encounter for general adult medical examination without abnormal findings: Secondary | ICD-10-CM | POA: Diagnosis not present

## 2017-06-15 DIAGNOSIS — J4520 Mild intermittent asthma, uncomplicated: Secondary | ICD-10-CM

## 2017-06-15 DIAGNOSIS — E7800 Pure hypercholesterolemia, unspecified: Secondary | ICD-10-CM | POA: Diagnosis not present

## 2017-06-15 DIAGNOSIS — E119 Type 2 diabetes mellitus without complications: Secondary | ICD-10-CM | POA: Diagnosis not present

## 2017-06-15 DIAGNOSIS — F325 Major depressive disorder, single episode, in full remission: Secondary | ICD-10-CM | POA: Diagnosis not present

## 2017-06-15 MED ORDER — LISINOPRIL 40 MG PO TABS: ORAL_TABLET | ORAL | 2 refills | Status: DC

## 2017-06-15 MED ORDER — VERAPAMIL HCL ER 240 MG PO TBCR: 240.0000 mg | EXTENDED_RELEASE_TABLET | Freq: Every day | ORAL | 0 refills | Status: DC

## 2017-06-15 MED ORDER — CYANOCOBALAMIN 1000 MCG/ML IJ SOLN: INTRAMUSCULAR | 0 refills | Status: DC

## 2017-06-15 MED ORDER — METFORMIN HCL 1000 MG PO TABS: 1000.0000 mg | ORAL_TABLET | Freq: Two times a day (BID) | ORAL | 1 refills | Status: DC

## 2017-06-15 MED ORDER — ROSUVASTATIN CALCIUM 5 MG PO TABS: 5.0000 mg | ORAL_TABLET | Freq: Every day | ORAL | 2 refills | Status: DC

## 2017-06-15 MED ORDER — CITALOPRAM HYDROBROMIDE 40 MG PO TABS: ORAL_TABLET | ORAL | 3 refills | Status: DC

## 2017-06-15 NOTE — Telephone Encounter (Signed)
See other message given to PCP

## 2017-06-15 NOTE — Progress Notes (Signed)
Patient ID: Glenda Zavala, female   DOB: [birth year], 66 y.o.   MRN: [id number]   Subjective:    Patient ID: Glenda Zavala, female    DOB: Feb 08, 1951, 66 y.o.   MRN: [id number]  HPI  Patient with past history of diabetes, GERD, hypertension and hypercholesterolemia.  She comes in today to follow up on these issues as well as for a complete physical exam. She is doing relatively well.  Is exercising.  Walking.  No chest pain.  No sob.  Breathing stable.  Continues to lose weight.  She saw Dr Flavia Shipper (gastric surgeon).  He did a fluoroscopy.  Was in good position.  Saline present.  Gave her a medication to take. Recommended f/u by phone in two weeks and in office in 3 months.  Had her eye exam 3 weeks ago.  Discussed lab results.  Increased stress with her boyfriends legal issues.  Discussed with her today.  Overall she feels she is handling things relatively well.     Past Medical History:  Diagnosis Date  . Anemia   . Arthritis   . COPD (chronic obstructive pulmonary disease) ([place])   . Depression   . Diabetes mellitus ([place])   . Environmental allergies   . GERD (gastroesophageal reflux disease)   . Heart murmur   . Hypercholesterolemia   . Hypertension   . PSVT (paroxysmal supraventricular tachycardia) ([place])    Past Surgical History:  Procedure Laterality Date  . ABDOMINAL HYSTERECTOMY  1990   secondary to bleeding and hyperplasia  . CESAREAN SECTION    . CHOLECYSTECTOMY N/A 03/30/2016   Procedure: LAPAROSCOPIC CHOLECYSTECTOMY;  Surgeon: Florene Glen, MD;  Location: [facility] ORS;  Service: General;  Laterality: N/A;  . LAPAROSCOPIC GASTRIC BANDING  2010  . TONSILLECTOMY    . TUBAL LIGATION     Family History  Problem Relation Age of Onset  . Hypercholesterolemia Mother   . Emphysema Maternal Grandfather   . Uterine cancer Unknown        aunt  . Breast cancer Neg Hx   . Colon cancer Neg Hx    Social History   Social History  . Marital status: Married    Spouse name: N/A   . Number of children: 2  . Years of education: N/A   Occupational History  . teacher    Social History Main Topics  . Smoking status: Never Smoker  . Smokeless tobacco: Never Used  . Alcohol use No  . Drug use: No  . Sexual activity: Not Asked   Other Topics Concern  . None   Social History Narrative  . None    Outpatient Encounter Prescriptions as of 06/15/2017  Medication Sig  . acetaminophen (TYLENOL) 650 MG CR tablet Take 650 mg by mouth every 8 (eight) hours as needed for pain.   Marland Kitchen albuterol (PROVENTIL) (2.5 MG/3ML) 0.083% nebulizer solution Take 3 mLs (2.5 mg total) by nebulization every 6 (six) hours as needed for shortness of breath.  . Blood Glucose Monitoring Suppl (ONETOUCH VERIO IQ SYSTEM) W/DEVICE KIT AS DIRECTED  . citalopram (CELEXA) 40 MG tablet TAKE ONE (1) TABLET BY MOUTH EVERY DAY  . cyanocobalamin (,VITAMIN B-12,) 1000 MCG/ML injection Inject 106mg q month  . fluticasone (FLONASE) 50 MCG/ACT nasal spray USE 2 SPRAYS EACH NOSTRIL ONE TIME DAILY (Patient taking differently: USE 2 SPRAYS EACH NOSTRIL ONE TIME DAILY AS NEEDED FOR ALLERGIES)  . hydrochlorothiazide (HYDRODIURIL) 25 MG tablet TAKE ONE (1) TABLET BY MOUTH EVERY DAY  .  Lancets (ACCU-CHEK SOFT TOUCH) lancets Use as instructed  . lisinopril (PRINIVIL,ZESTRIL) 40 MG tablet TAKE ONE TABLET BY MOUTH EVERY MORNING FOR BLOOD PRESSURE  . metFORMIN (GLUCOPHAGE) 1000 MG tablet Take 1 tablet (1,000 mg total) by mouth 2 (two) times daily.  . montelukast (SINGULAIR) 10 MG tablet Take 10 mg by mouth at bedtime as needed (allergies).   . Multiple Vitamin (MULTIVITAMIN WITH MINERALS) TABS tablet Take 1 tablet by mouth daily.  Marland Kitchen NYAMYC powder APPLY TO AFFECTED AREAS TWICE DAILY  . nystatin cream (MYCOSTATIN) APPLY TO AFFECTED AREAS TOPICALLY TWICE A DAY (Patient taking differently: APPLY TO AFFECTED AREAS TOPICALLY TWICE A DAY AS NEEDED)  . ONETOUCH VERIO test strip TEST BLOOD GLUCOSE TWICE DAILY  . phentermine  37.5 MG capsule Take by mouth.  . rosuvastatin (CRESTOR) 5 MG tablet Take 1 tablet (5 mg total) by mouth at bedtime.  . Syringe/Needle, Disp, (SYRINGE 3CC/25GX1") 25G X 1" 3 ML MISC 1 application by Does not apply route once a week.  . tiotropium (SPIRIVA HANDIHALER) 18 MCG inhalation capsule INHALE CONTENTS OF ONE CAPSULE ONCE A DAY AS DIRECTED  . triamcinolone cream (KENALOG) 0.1 % Apply 1 application topically 2 (two) times daily.  . VENTOLIN HFA 108 (90 Base) MCG/ACT inhaler INHALE TWO PUFFS EVERY 6 HOURS AS NEEDEDWHEEZING OR SHORTNESS OF BREATH  . verapamil (CALAN-SR) 240 MG CR tablet Take 1 tablet (240 mg total) by mouth daily.  . [DISCONTINUED] citalopram (CELEXA) 40 MG tablet TAKE ONE (1) TABLET BY MOUTH EVERY DAY  . [DISCONTINUED] cyanocobalamin (,VITAMIN B-12,) 1000 MCG/ML injection Inject 1021mg q month  . [DISCONTINUED] lisinopril (PRINIVIL,ZESTRIL) 40 MG tablet TAKE ONE TABLET BY MOUTH EVERY MORNING FOR BLOOD PRESSURE  . [DISCONTINUED] metFORMIN (GLUCOPHAGE) 1000 MG tablet TAKE ONE TABLET TWICE DAILY  . [DISCONTINUED] rosuvastatin (CRESTOR) 5 MG tablet TAKE ONE TABLET DAILY AT BEDTIME  . [DISCONTINUED] verapamil (CALAN-SR) 240 MG CR tablet TAKE ONE TABLET BY MOUTH EVERY DAY   No facility-administered encounter medications on file as of 06/15/2017.     Review of Systems  Constitutional: Negative for appetite change and unexpected weight change.  HENT: Negative for congestion and sinus pressure.   Eyes: Negative for pain and visual disturbance.  Respiratory: Negative for cough, chest tightness and shortness of breath.   Cardiovascular: Negative for chest pain, palpitations and leg swelling.  Gastrointestinal: Negative for abdominal pain, diarrhea, nausea and vomiting.  Genitourinary: Negative for difficulty urinating and dysuria.  Musculoskeletal: Negative for back pain and myalgias.  Skin: Negative for color change and rash.  Neurological: Negative for dizziness,  light-headedness and headaches.  Hematological: Negative for adenopathy. Does not bruise/bleed easily.  Psychiatric/Behavioral: Negative for agitation and dysphoric mood.       Objective:    Physical Exam  Constitutional: She is oriented to person, place, and time. She appears well-developed and well-nourished. No distress.  HENT:  Nose: Nose normal.  Mouth/Throat: Oropharynx is clear and moist.  Eyes: Right eye exhibits no discharge. Left eye exhibits no discharge. No scleral icterus.  Neck: Neck supple. No thyromegaly present.  Cardiovascular: Normal rate and regular rhythm.   Pulmonary/Chest: Breath sounds normal. No accessory muscle usage. No tachypnea. No respiratory distress. She has no decreased breath sounds. She has no wheezes. She has no rhonchi. Right breast exhibits no inverted nipple, no mass, no nipple discharge and no tenderness (no axillary adenopathy). Left breast exhibits no inverted nipple, no mass, no nipple discharge and no tenderness (no axilarry adenopathy).  Abdominal: Soft. Bowel  sounds are normal. There is no tenderness.  Musculoskeletal: She exhibits no edema or tenderness.  Lymphadenopathy:    She has no cervical adenopathy.  Neurological: She is alert and oriented to person, place, and time.  Skin: Skin is warm. No rash noted. No erythema.  Psychiatric: She has a normal mood and affect. Her behavior is normal.    BP 124/76 (BP Location: Left Arm, Patient Position: Sitting, Cuff Size: Large)   Pulse 64   Temp 98.3 F (36.8 C) (Oral)   Resp 16   Ht 5' 2.99" (1.6 m)   Wt 264 lb 3.2 oz (119.8 kg)   SpO2 98%   BMI 46.81 kg/m  Wt Readings from Last 3 Encounters:  06/15/17 264 lb 3.2 oz (119.8 kg)  03/14/17 270 lb 6.4 oz (122.7 kg)  10/18/16 280 lb 9.6 oz (127.3 kg)     Lab Results  Component Value Date   WBC 7.3 05/15/2017   HGB 12.3 05/15/2017   HCT 38.0 05/15/2017   PLT 269.0 05/15/2017   GLUCOSE 99 05/15/2017   CHOL 164 05/15/2017   TRIG  90.0 05/15/2017   HDL 56.20 05/15/2017   LDLCALC 90 05/15/2017   ALT 8 05/15/2017   AST 6 05/15/2017   NA 142 05/15/2017   K 3.8 05/15/2017   CL 103 05/15/2017   CREATININE 0.78 05/15/2017   BUN 18 05/15/2017   CO2 30 05/15/2017   TSH 2.30 10/18/2016   HGBA1C 6.1 05/15/2017   MICROALBUR 2.1 (H) 11/01/2016    Mm Screening Breast Tomo Bilateral  Result Date: 06/02/2017 CLINICAL DATA:  Screening. EXAM: 2D DIGITAL SCREENING BILATERAL MAMMOGRAM WITH CAD AND ADJUNCT TOMO COMPARISON:  Previous exam(s). ACR Breast Density Category b: There are scattered areas of fibroglandular density. FINDINGS: There are no findings suspicious for malignancy. Images were processed with CAD. IMPRESSION: No mammographic evidence of malignancy. A result letter of this screening mammogram will be mailed directly to the patient. RECOMMENDATION: Screening mammogram in one year. (Code:SM-B-01Y) BI-RADS CATEGORY  1: Negative. Electronically Signed   By: Lillia Mountain M.D.   On: 06/02/2017 09:28       Assessment & Plan:   Problem List Items Addressed This Visit    Anemia - Primary    Last hgb wnl.  Recheck cbc and ferritin with next labs.  Colonoscopy 01/01/13 - as outlined.  Recommended f/u in 5-10 years.        Relevant Medications   cyanocobalamin (,VITAMIN B-12,) 1000 MCG/ML injection   Other Relevant Orders   CBC with Differential/Platelet   Ferritin   Asthma    Breathing stable.        Atrial fibrillation and flutter [facility])    Sees cardiology.  In SR.  Stable.        Relevant Medications   lisinopril (PRINIVIL,ZESTRIL) 40 MG tablet   verapamil (CALAN-SR) 240 MG CR tablet   rosuvastatin (CRESTOR) 5 MG tablet   Diabetes mellitus (HCC)    Low carb diet and exercise.  Follow met b and a1c.  Has adjusted diet.  Lost weight.        Relevant Medications   lisinopril (PRINIVIL,ZESTRIL) 40 MG tablet   metFORMIN (GLUCOPHAGE) 1000 MG tablet   rosuvastatin (CRESTOR) 5 MG tablet   Other Relevant Orders    Hemoglobin A1c   Health care maintenance    Physical today 06/15/17. Colonoscopy 12/2012.  Recommended f/u in 5-10 years.  Mammogram 06/02/17 - Birads I.  PAP 06/13/16 - satisfactory for evaluation.  Negative HPV.        Hx of laparoscopic gastric banding    Just evaluated.  W/up as outlined.  Placed on medication.  Follow.        Hypercholesterolemia    On crestor.  Low cholesterol diet and exercise.  Follow lipid panel and liver function tests.        Relevant Medications   lisinopril (PRINIVIL,ZESTRIL) 40 MG tablet   verapamil (CALAN-SR) 240 MG CR tablet   rosuvastatin (CRESTOR) 5 MG tablet   Other Relevant Orders   Hepatic function panel   Lipid panel   Hypertension    Blood pressure under good control.  Continue same medication regimen.  Follow pressures.  Follow metabolic panel.        Relevant Medications   lisinopril (PRINIVIL,ZESTRIL) 40 MG tablet   verapamil (CALAN-SR) 240 MG CR tablet   rosuvastatin (CRESTOR) 5 MG tablet   Other Relevant Orders   TSH   Basic metabolic panel   Major depressive disorder with single episode    Increased stress as outlined.  On citalopram.  Does not feel needs anything more at this time.  Follow.        Relevant Medications   citalopram (CELEXA) 40 MG tablet   Severe obesity (BMI >= 40) (HCC)    Losing weight.  Has adjusted her diet.  Saw her surgeon recently.  She is walking.  Continue diet and exercise.  Follow.        Relevant Medications   phentermine 37.5 MG capsule   metFORMIN (GLUCOPHAGE) 1000 MG tablet       Einar Pheasant, MD

## 2017-06-15 NOTE — Telephone Encounter (Signed)
Pt dropped off DMV paperwork to be filled out. Placed in Dr. Lars Mage colored folder upfront. Please advise please mail when completed   Pt also wanted to let Dr. Nicki Reaper know the other medication she started was Phertermine HCL 37.5mg 

## 2017-06-15 NOTE — Assessment & Plan Note (Signed)
Physical today 06/15/17. Colonoscopy 12/2012.  Recommended f/u in 5-10 years.  Mammogram 06/02/17 - Birads I.  PAP 06/13/16 - satisfactory for evaluation.  Negative HPV.

## 2017-06-15 NOTE — Telephone Encounter (Signed)
Patient brought by a form from [facility] that needs to be filled out. Put in red folder.

## 2017-06-17 ENCOUNTER — Encounter: Payer: Self-pay | Admitting: Internal Medicine

## 2017-06-17 NOTE — Assessment & Plan Note (Signed)
Blood pressure under good control.  Continue same medication regimen.  Follow pressures.  Follow metabolic panel.   

## 2017-06-17 NOTE — Assessment & Plan Note (Signed)
Last hgb wnl.  Recheck cbc and ferritin with next labs.  Colonoscopy 01/01/13 - as outlined.  Recommended f/u in 5-10 years.

## 2017-06-17 NOTE — Assessment & Plan Note (Signed)
On crestor.  Low cholesterol diet and exercise.  Follow lipid panel and liver function tests.   

## 2017-06-17 NOTE — Assessment & Plan Note (Signed)
Losing weight.  Has adjusted her diet.  Saw her surgeon recently.  She is walking.  Continue diet and exercise.  Follow.

## 2017-06-17 NOTE — Assessment & Plan Note (Signed)
Low carb diet and exercise.  Follow met b and a1c.  Has adjusted diet.  Lost weight.

## 2017-06-17 NOTE — Assessment & Plan Note (Signed)
Increased stress as outlined.  On citalopram.  Does not feel needs anything more at this time.  Follow.

## 2017-06-17 NOTE — Assessment & Plan Note (Signed)
Breathing stable.

## 2017-06-17 NOTE — Assessment & Plan Note (Signed)
Just evaluated.  W/up as outlined.  Placed on medication.  Follow.

## 2017-06-17 NOTE — Assessment & Plan Note (Signed)
Sees cardiology.  In SR.  Stable.

## 2017-06-19 ENCOUNTER — Other Ambulatory Visit: Payer: Self-pay | Admitting: Internal Medicine

## 2017-06-26 NOTE — Telephone Encounter (Signed)
Form completed.  She will need to sign her part

## 2017-06-27 ENCOUNTER — Telehealth: Payer: Self-pay | Admitting: *Deleted

## 2017-06-27 NOTE — Telephone Encounter (Signed)
See open phone note  

## 2017-06-27 NOTE — Telephone Encounter (Signed)
Called patient she will come by in the am and sign ppw so that I can mail off.

## 2017-06-27 NOTE — Telephone Encounter (Signed)
Called patient l/m to call office  

## 2017-06-27 NOTE — Telephone Encounter (Signed)
Copied from [place] #[id number]. Topic: Inquiry >> Jun 27, 2017 10:14 AM Corie Chiquito, NT wrote: Reason for CRM: Patient had a missed call and msg to call office back from a [place]. If someone could give her a call back about this matter

## 2017-06-29 NOTE — Telephone Encounter (Signed)
ppw mailed copy sent to scan.

## 2017-07-04 NOTE — Telephone Encounter (Signed)
New form to d/c reason for DMV ppw put in red folder for you review.

## 2017-07-12 ENCOUNTER — Telehealth: Payer: Self-pay

## 2017-07-12 NOTE — Telephone Encounter (Signed)
Called patient to let her know that form I have for DMV to stop requesting reviews if something she has to fill out and send in. It is in my red folder. Need to see if she wants to come by and pick up or if she wants [place] to mail it to her.

## 2017-08-04 ENCOUNTER — Ambulatory Visit: Payer: Self-pay

## 2017-08-21 ENCOUNTER — Other Ambulatory Visit: Payer: Self-pay | Admitting: Internal Medicine

## 2017-10-05 ENCOUNTER — Encounter: Payer: Self-pay | Admitting: Internal Medicine

## 2017-10-09 DIAGNOSIS — J449 Chronic obstructive pulmonary disease, unspecified: Secondary | ICD-10-CM | POA: Diagnosis not present

## 2017-10-16 ENCOUNTER — Other Ambulatory Visit (INDEPENDENT_AMBULATORY_CARE_PROVIDER_SITE_OTHER): Payer: PPO

## 2017-10-16 DIAGNOSIS — I10 Essential (primary) hypertension: Secondary | ICD-10-CM | POA: Diagnosis not present

## 2017-10-16 DIAGNOSIS — E119 Type 2 diabetes mellitus without complications: Secondary | ICD-10-CM | POA: Diagnosis not present

## 2017-10-16 DIAGNOSIS — E7800 Pure hypercholesterolemia, unspecified: Secondary | ICD-10-CM | POA: Diagnosis not present

## 2017-10-16 DIAGNOSIS — D649 Anemia, unspecified: Secondary | ICD-10-CM | POA: Diagnosis not present

## 2017-10-16 LAB — FERRITIN: Ferritin: 6.4 ng/mL — ABNORMAL LOW (ref 10.0–291.0)

## 2017-10-16 LAB — CBC WITH DIFFERENTIAL/PLATELET
BASOS ABS: 0 10*3/uL (ref 0.0–0.1)
BASOS PCT: 0.7 % (ref 0.0–3.0)
EOS ABS: 0.2 10*3/uL (ref 0.0–0.7)
Eosinophils Relative: 3.3 % (ref 0.0–5.0)
HEMATOCRIT: 34 % — AB (ref 36.0–46.0)
HEMOGLOBIN: 11.3 g/dL — AB (ref 12.0–15.0)
LYMPHS PCT: 25 % (ref 12.0–46.0)
Lymphs Abs: 1.6 10*3/uL (ref 0.7–4.0)
MCHC: 33.1 g/dL (ref 30.0–36.0)
MCV: 79.6 fl (ref 78.0–100.0)
Monocytes Absolute: 0.5 10*3/uL (ref 0.1–1.0)
Monocytes Relative: 8.2 % (ref 3.0–12.0)
Neutro Abs: 4 10*3/uL (ref 1.4–7.7)
Neutrophils Relative %: 62.8 % (ref 43.0–77.0)
Platelets: 262 10*3/uL (ref 150.0–400.0)
RBC: 4.28 Mil/uL (ref 3.87–5.11)
RDW: 14.6 % (ref 11.5–15.5)
WBC: 6.3 10*3/uL (ref 4.0–10.5)

## 2017-10-16 LAB — LIPID PANEL
Cholesterol: 169 mg/dL (ref 0–200)
HDL: 63.7 mg/dL (ref >39.00)
LDL CALC: 94 mg/dL (ref 0–99)
NONHDL: 104.82
Total CHOL/HDL Ratio: 3
Triglycerides: 55 mg/dL (ref 0.0–149.0)
VLDL: 11 mg/dL (ref 0.0–40.0)

## 2017-10-16 LAB — HEPATIC FUNCTION PANEL
ALK PHOS: 63 U/L (ref 39–117)
ALT: 6 U/L (ref 0–35)
AST: 5 U/L (ref 0–37)
Albumin: 3.6 g/dL (ref 3.5–5.2)
BILIRUBIN DIRECT: 0 mg/dL (ref 0.0–0.3)
BILIRUBIN TOTAL: 0.4 mg/dL (ref 0.2–1.2)
Total Protein: 6.3 g/dL (ref 6.0–8.3)

## 2017-10-16 LAB — BASIC METABOLIC PANEL
BUN: 16 mg/dL (ref 6–23)
CALCIUM: 9.2 mg/dL (ref 8.4–10.5)
CO2: 29 mEq/L (ref 19–32)
Chloride: 102 mEq/L (ref 96–112)
Creatinine, Ser: 0.83 mg/dL (ref 0.40–1.20)
GFR: 73.5 mL/min (ref >60.00)
GLUCOSE: 100 mg/dL — AB (ref 70–99)
Potassium: 3.6 mEq/L (ref 3.5–5.1)
SODIUM: 140 meq/L (ref 135–145)

## 2017-10-16 LAB — TSH: TSH: 2.55 u[IU]/mL (ref 0.35–4.50)

## 2017-10-16 LAB — HEMOGLOBIN A1C: HEMOGLOBIN A1C: 6.1 % (ref 4.6–6.5)

## 2017-10-18 ENCOUNTER — Ambulatory Visit: Payer: PPO | Admitting: Internal Medicine

## 2017-10-26 ENCOUNTER — Encounter: Payer: Self-pay | Admitting: Internal Medicine

## 2017-11-06 DIAGNOSIS — J449 Chronic obstructive pulmonary disease, unspecified: Secondary | ICD-10-CM | POA: Diagnosis not present

## 2017-11-10 ENCOUNTER — Encounter: Payer: Self-pay | Admitting: Internal Medicine

## 2017-11-10 ENCOUNTER — Ambulatory Visit (INDEPENDENT_AMBULATORY_CARE_PROVIDER_SITE_OTHER): Payer: PPO | Admitting: Internal Medicine

## 2017-11-10 VITALS — BP 128/72 | HR 62 | Temp 98.6°F | Resp 20 | Wt 265.2 lb

## 2017-11-10 DIAGNOSIS — J4520 Mild intermittent asthma, uncomplicated: Secondary | ICD-10-CM | POA: Diagnosis not present

## 2017-11-10 DIAGNOSIS — I4891 Unspecified atrial fibrillation: Secondary | ICD-10-CM

## 2017-11-10 DIAGNOSIS — Z9884 Bariatric surgery status: Secondary | ICD-10-CM

## 2017-11-10 DIAGNOSIS — E7800 Pure hypercholesterolemia, unspecified: Secondary | ICD-10-CM | POA: Diagnosis not present

## 2017-11-10 DIAGNOSIS — I10 Essential (primary) hypertension: Secondary | ICD-10-CM | POA: Diagnosis not present

## 2017-11-10 DIAGNOSIS — K219 Gastro-esophageal reflux disease without esophagitis: Secondary | ICD-10-CM

## 2017-11-10 DIAGNOSIS — Z6841 Body Mass Index (BMI) 40.0 and over, adult: Secondary | ICD-10-CM | POA: Diagnosis not present

## 2017-11-10 DIAGNOSIS — F329 Major depressive disorder, single episode, unspecified: Secondary | ICD-10-CM

## 2017-11-10 DIAGNOSIS — E119 Type 2 diabetes mellitus without complications: Secondary | ICD-10-CM

## 2017-11-10 DIAGNOSIS — D649 Anemia, unspecified: Secondary | ICD-10-CM

## 2017-11-10 DIAGNOSIS — Z1239 Encounter for other screening for malignant neoplasm of breast: Secondary | ICD-10-CM

## 2017-11-10 DIAGNOSIS — F325 Major depressive disorder, single episode, in full remission: Secondary | ICD-10-CM | POA: Diagnosis not present

## 2017-11-10 DIAGNOSIS — Z1231 Encounter for screening mammogram for malignant neoplasm of breast: Secondary | ICD-10-CM

## 2017-11-10 DIAGNOSIS — M25512 Pain in left shoulder: Secondary | ICD-10-CM

## 2017-11-10 DIAGNOSIS — I4892 Unspecified atrial flutter: Secondary | ICD-10-CM

## 2017-11-10 DIAGNOSIS — F32A Depression, unspecified: Secondary | ICD-10-CM

## 2017-11-10 LAB — CBC WITH DIFFERENTIAL/PLATELET
Basophils Absolute: 0 10*3/uL (ref 0.0–0.1)
Basophils Relative: 0.8 % (ref 0.0–3.0)
Eosinophils Absolute: 0.2 10*3/uL (ref 0.0–0.7)
Eosinophils Relative: 2.8 % (ref 0.0–5.0)
HCT: 34.9 % — ABNORMAL LOW (ref 36.0–46.0)
HEMOGLOBIN: 11.6 g/dL — AB (ref 12.0–15.0)
Lymphocytes Relative: 26.3 % (ref 12.0–46.0)
Lymphs Abs: 1.6 10*3/uL (ref 0.7–4.0)
MCHC: 33.1 g/dL (ref 30.0–36.0)
MCV: 78.7 fl (ref 78.0–100.0)
MONO ABS: 0.5 10*3/uL (ref 0.1–1.0)
MONOS PCT: 7.7 % (ref 3.0–12.0)
Neutro Abs: 3.9 10*3/uL (ref 1.4–7.7)
Neutrophils Relative %: 62.4 % (ref 43.0–77.0)
Platelets: 262 10*3/uL (ref 150.0–400.0)
RBC: 4.43 Mil/uL (ref 3.87–5.11)
RDW: 14.3 % (ref 11.5–15.5)
WBC: 6.2 10*3/uL (ref 4.0–10.5)

## 2017-11-10 LAB — FERRITIN: FERRITIN: 6.3 ng/mL — AB (ref 10.0–291.0)

## 2017-11-10 MED ORDER — CYANOCOBALAMIN 1000 MCG/ML IJ SOLN: INTRAMUSCULAR | 0 refills | Status: DC

## 2017-11-10 MED ORDER — ROSUVASTATIN CALCIUM 5 MG PO TABS: 5.0000 mg | ORAL_TABLET | Freq: Every day | ORAL | 1 refills | Status: DC

## 2017-11-10 MED ORDER — VERAPAMIL HCL ER 240 MG PO TBCR: 240.0000 mg | EXTENDED_RELEASE_TABLET | Freq: Every day | ORAL | 1 refills | Status: DC

## 2017-11-10 MED ORDER — HYDROCHLOROTHIAZIDE 25 MG PO TABS: 25.0000 mg | ORAL_TABLET | Freq: Every day | ORAL | 1 refills | Status: DC

## 2017-11-10 MED ORDER — CITALOPRAM HYDROBROMIDE 40 MG PO TABS: ORAL_TABLET | ORAL | 1 refills | Status: DC

## 2017-11-10 MED ORDER — "SYRINGE 25G X 1"" 3 ML MISC": 1.0000 "application " | 0 refills | Status: AC

## 2017-11-10 MED ORDER — METFORMIN HCL 1000 MG PO TABS: 1000.0000 mg | ORAL_TABLET | Freq: Two times a day (BID) | ORAL | 1 refills | Status: DC

## 2017-11-10 MED ORDER — LISINOPRIL 40 MG PO TABS: ORAL_TABLET | ORAL | 2 refills | Status: DC

## 2017-11-10 NOTE — Progress Notes (Signed)
Patient ID: Glenda Zavala, female   DOB: Apr 26, 1951, 67 y.o.   MRN: [id number]   Subjective:    Patient ID: Glenda Zavala, female    DOB: December 12, 1950, 67 y.o.   MRN: [id number]  HPI  Patient here for a scheduled follow up.  She reports she is doing relatively well.  Was having issues with acid reflux.  This has resolved.  No acid reflux.  No swallowing problems.  Reviewed labs.  hgb 11.3.  Iron decreased.  She is s/p gastric surgery.  Had colonoscopy 01/01/13.  Recommended f/u colonoscopy in 5-10 years.  Reports no abdominal pain.  Bowels moving.  No blood.  No chest pain.  Breathing stable.  Increased stress with her boyfriend's legal issues.  Discussed with her today.  Overall she feels she is handling things relatively well.  Does report left shoulder discomfort.  Request referral to ortho.     Past Medical History:  Diagnosis Date  . Anemia   . Arthritis   . COPD (chronic obstructive pulmonary disease) ([place])   . Depression   . Diabetes mellitus ([place])   . Environmental allergies   . GERD (gastroesophageal reflux disease)   . Heart murmur   . Hypercholesterolemia   . Hypertension   . PSVT (paroxysmal supraventricular tachycardia) ([place])    Past Surgical History:  Procedure Laterality Date  . ABDOMINAL HYSTERECTOMY  1990   secondary to bleeding and hyperplasia  . CESAREAN SECTION    . CHOLECYSTECTOMY N/A 03/30/2016   Procedure: LAPAROSCOPIC CHOLECYSTECTOMY;  Surgeon: Florene Glen, MD;  Location: [facility] ORS;  Service: General;  Laterality: N/A;  . LAPAROSCOPIC GASTRIC BANDING  2010  . TONSILLECTOMY    . TUBAL LIGATION     Family History  Problem Relation Age of Onset  . Hypercholesterolemia Mother   . Emphysema Maternal Grandfather   . Uterine cancer Unknown        aunt  . Breast cancer Neg Hx   . Colon cancer Neg Hx    Social History   Socioeconomic History  . Marital status: Married    Spouse name: Not on file  . Number of children: 2  . Years of education: Not on  file  . Highest education level: Not on file  Occupational History  . Occupation: Pharmacist, hospital  Social Needs  . Financial resource strain: Not on file  . Food insecurity:    Worry: Not on file    Inability: Not on file  . Transportation needs:    Medical: Not on file    Non-medical: Not on file  Tobacco Use  . Smoking status: Never Smoker  . Smokeless tobacco: Never Used  Substance and Sexual Activity  . Alcohol use: No    Alcohol/week: 0.0 oz  . Drug use: No  . Sexual activity: Not on file  Lifestyle  . Physical activity:    Days per week: Not on file    Minutes per session: Not on file  . Stress: Not on file  Relationships  . Social connections:    Talks on phone: Not on file    Gets together: Not on file    Attends religious service: Not on file    Active member of club or organization: Not on file    Attends meetings of clubs or organizations: Not on file    Relationship status: Not on file  Other Topics Concern  . Not on file  Social History Narrative  . Not on file  Outpatient Encounter Medications as of 11/10/2017  Medication Sig  . acetaminophen (TYLENOL) 650 MG CR tablet Take 650 mg by mouth every 8 (eight) hours as needed for pain.   Marland Kitchen albuterol (PROVENTIL) (2.5 MG/3ML) 0.083% nebulizer solution Take 3 mLs (2.5 mg total) by nebulization every 6 (six) hours as needed for shortness of breath.  . Blood Glucose Monitoring Suppl (ONETOUCH VERIO IQ SYSTEM) W/DEVICE KIT AS DIRECTED  . citalopram (CELEXA) 40 MG tablet TAKE ONE (1) TABLET BY MOUTH EVERY DAY  . cyanocobalamin (,VITAMIN B-12,) 1000 MCG/ML injection Inject 1024mg q month  . fluticasone (FLONASE) 50 MCG/ACT nasal spray USE 2 SPRAYS EACH NOSTRIL DAILY  . hydrochlorothiazide (HYDRODIURIL) 25 MG tablet Take 1 tablet (25 mg total) by mouth daily.  . Lancets (ACCU-CHEK SOFT TOUCH) lancets Use as instructed  . lisinopril (PRINIVIL,ZESTRIL) 40 MG tablet TAKE ONE TABLET BY MOUTH EVERY MORNING FOR BLOOD PRESSURE  .  metFORMIN (GLUCOPHAGE) 1000 MG tablet Take 1 tablet (1,000 mg total) by mouth 2 (two) times daily.  . montelukast (SINGULAIR) 10 MG tablet Take 10 mg by mouth at bedtime as needed (allergies).   . Multiple Vitamin (MULTIVITAMIN WITH MINERALS) TABS tablet Take 1 tablet by mouth daily.  .Marland KitchenNYAMYC powder APPLY TO AFFECTED AREAS TWICE DAILY  . nystatin cream (MYCOSTATIN) APPLY TO AFFECTED AREAS TOPICALLY TWICE A DAY (Patient taking differently: APPLY TO AFFECTED AREAS TOPICALLY TWICE A DAY AS NEEDED)  . ONETOUCH VERIO test strip TEST BLOOD GLUCOSE TWICE DAILY  . phentermine 37.5 MG capsule Take by mouth.  . rosuvastatin (CRESTOR) 5 MG tablet Take 1 tablet (5 mg total) by mouth at bedtime.  . Syringe/Needle, Disp, (SYRINGE 3CC/25GX1") 25G X 1" 3 ML MISC 1 application by Does not apply route once a week.  . tiotropium (SPIRIVA HANDIHALER) 18 MCG inhalation capsule INHALE CONTENTS OF ONE CAPSULE ONCE A DAY AS DIRECTED  . triamcinolone cream (KENALOG) 0.1 % Apply 1 application topically 2 (two) times daily.  . VENTOLIN HFA 108 (90 Base) MCG/ACT inhaler INHALE TWO PUFFS EVERY 6 HOURS AS NEEDEDWHEEZING OR SHORTNESS OF BREATH  . verapamil (CALAN-SR) 240 MG CR tablet Take 1 tablet (240 mg total) by mouth daily.  . [DISCONTINUED] citalopram (CELEXA) 40 MG tablet TAKE ONE (1) TABLET BY MOUTH EVERY DAY  . [DISCONTINUED] cyanocobalamin (,VITAMIN B-12,) 1000 MCG/ML injection Inject 10075m q month  . [DISCONTINUED] hydrochlorothiazide (HYDRODIURIL) 25 MG tablet TAKE ONE TABLET BY MOUTH EVERY DAY  . [DISCONTINUED] lisinopril (PRINIVIL,ZESTRIL) 40 MG tablet TAKE ONE TABLET BY MOUTH EVERY MORNING FOR BLOOD PRESSURE  . [DISCONTINUED] metFORMIN (GLUCOPHAGE) 1000 MG tablet Take 1 tablet (1,000 mg total) by mouth 2 (two) times daily.  . [DISCONTINUED] rosuvastatin (CRESTOR) 5 MG tablet Take 1 tablet (5 mg total) by mouth at bedtime.  . [DISCONTINUED] Syringe/Needle, Disp, (SYRINGE 3CC/25GX1") 25G X 1" 3 ML MISC 1  application by Does not apply route once a week.  . [DISCONTINUED] verapamil (CALAN-SR) 240 MG CR tablet Take 1 tablet (240 mg total) by mouth daily.   No facility-administered encounter medications on file as of 11/10/2017.     Review of Systems  Constitutional: Negative for appetite change and unexpected weight change.  HENT: Negative for congestion and sinus pressure.   Respiratory: Negative for cough and chest tightness.        Breathing stable. No increased sob.   Cardiovascular: Negative for chest pain, palpitations and leg swelling.  Gastrointestinal: Negative for abdominal pain, diarrhea, nausea and vomiting.  Genitourinary: Negative for  difficulty urinating and dysuria.  Musculoskeletal: Negative for joint swelling and myalgias.       Left shoulder pain as outlined.    Skin: Negative for color change and rash.  Neurological: Negative for dizziness, light-headedness and headaches.  Psychiatric/Behavioral: Negative for agitation and dysphoric mood.       Increased stress as outlined.         Objective:     Blood pressure rechecked by me:  128/72  Physical Exam  Constitutional: She appears well-developed and well-nourished. No distress.  HENT:  Nose: Nose normal.  Mouth/Throat: Oropharynx is clear and moist.  Neck: Neck supple. No thyromegaly present.  Cardiovascular: Normal rate and regular rhythm.  Pulmonary/Chest: Breath sounds normal. No respiratory distress. She has no wheezes.  Abdominal: Soft. Bowel sounds are normal. There is no tenderness.  Musculoskeletal: She exhibits no edema or tenderness.  Lymphadenopathy:    She has no cervical adenopathy.  Skin: No rash noted. No erythema.  Psychiatric: She has a normal mood and affect. Her behavior is normal.    BP 128/72   Pulse 62   Temp 98.6 F (37 C) (Oral)   Resp 20   Wt 265 lb 3.2 oz (120.3 kg)   SpO2 98%   BMI 46.99 kg/m  Wt Readings from Last 3 Encounters:  11/10/17 265 lb 3.2 oz (120.3 kg)  06/15/17  264 lb 3.2 oz (119.8 kg)  03/14/17 270 lb 6.4 oz (122.7 kg)     Lab Results  Component Value Date   WBC 6.2 11/10/2017   HGB 11.6 (L) 11/10/2017   HCT 34.9 (L) 11/10/2017   PLT 262.0 11/10/2017   GLUCOSE 100 (H) 10/16/2017   CHOL 169 10/16/2017   TRIG 55.0 10/16/2017   HDL 63.70 10/16/2017   LDLCALC 94 10/16/2017   ALT 6 10/16/2017   AST 5 10/16/2017   NA 140 10/16/2017   K 3.6 10/16/2017   CL 102 10/16/2017   CREATININE 0.83 10/16/2017   BUN 16 10/16/2017   CO2 29 10/16/2017   TSH 2.55 10/16/2017   HGBA1C 6.1 10/16/2017   MICROALBUR 2.1 (H) 11/01/2016    Mm Screening Breast Tomo Bilateral  Result Date: 06/02/2017 CLINICAL DATA:  Screening. EXAM: 2D DIGITAL SCREENING BILATERAL MAMMOGRAM WITH CAD AND ADJUNCT TOMO COMPARISON:  Previous exam(s). ACR Breast Density Category b: There are scattered areas of fibroglandular density. FINDINGS: There are no findings suspicious for malignancy. Images were processed with CAD. IMPRESSION: No mammographic evidence of malignancy. A result letter of this screening mammogram will be mailed directly to the patient. RECOMMENDATION: Screening mammogram in one year. (Code:SM-B-01Y) BI-RADS CATEGORY  1: Negative. Electronically Signed   By: Lillia Mountain M.D.   On: 06/02/2017 09:28       Assessment & Plan:   Problem List Items Addressed This Visit    Anemia - Primary    Recent hgb 11.3.  Iron low.  Is s/p gastric surgery.  Colonoscopy 12/2012.  Hemoccult cards given.  Recheck cbc.        Relevant Medications   cyanocobalamin (,VITAMIN B-12,) 1000 MCG/ML injection   Other Relevant Orders   CBC with Differential/Platelet (Completed)   Ferritin (Completed)   CBC with Differential/Platelet   Ferritin   Asthma    Breathing stable.  Follow.        Atrial fibrillation and flutter (HCC)    In SR.  Stable.  Has been followed by cardiology.       Relevant Medications   hydrochlorothiazide (HYDRODIURIL)  25 MG tablet   lisinopril  (PRINIVIL,ZESTRIL) 40 MG tablet   rosuvastatin (CRESTOR) 5 MG tablet   verapamil (CALAN-SR) 240 MG CR tablet   BMI 45.0-49.9, adult (HCC)    Diet and exercise.  Follow.        Relevant Medications   metFORMIN (GLUCOPHAGE) 1000 MG tablet   Breast cancer screening   Relevant Orders   MM DIGITAL SCREENING BILATERAL   Controlled type 2 diabetes mellitus without complication (HCC)    Continue diet and exercise.  Follow met b and a1c.        Relevant Medications   lisinopril (PRINIVIL,ZESTRIL) 40 MG tablet   metFORMIN (GLUCOPHAGE) 1000 MG tablet   rosuvastatin (CRESTOR) 5 MG tablet   Other Relevant Orders   Hemoglobin A1c   Microalbumin / creatinine urine ratio   Basic metabolic panel   Depression    Doing well on citalopram.  Discussed with her today.  Desires no further intervention.  Follow.       Relevant Medications   citalopram (CELEXA) 40 MG tablet   GERD (gastroesophageal reflux disease)    Currently dong well.  No acid reflux. No swallowing problems.  Follow.        Hx of laparoscopic gastric banding    No acid reflux or swallowing problems.  Follow.        Hypercholesterolemia    On crestor.  Low cholesterol diet and exercise.  Follow lipid panel and liver function tests.        Relevant Medications   hydrochlorothiazide (HYDRODIURIL) 25 MG tablet   lisinopril (PRINIVIL,ZESTRIL) 40 MG tablet   rosuvastatin (CRESTOR) 5 MG tablet   verapamil (CALAN-SR) 240 MG CR tablet   Other Relevant Orders   Hepatic function panel   Lipid panel   Hypertension    Blood pressure on recheck wnl.  Has been under good control.  Follow pressures.  Follow metabolic panel.        Relevant Medications   hydrochlorothiazide (HYDRODIURIL) 25 MG tablet   lisinopril (PRINIVIL,ZESTRIL) 40 MG tablet   rosuvastatin (CRESTOR) 5 MG tablet   verapamil (CALAN-SR) 240 MG CR tablet   Major depressive disorder with single episode    On citalopram.  Declines need for further intervention.   Follow.       Relevant Medications   citalopram (CELEXA) 40 MG tablet    Other Visit Diagnoses    Left shoulder pain, unspecified chronicity       Persistent.  request referral to ortho.     Relevant Orders   Ambulatory referral to Orthopedic Surgery       Einar Pheasant, MD

## 2017-11-13 ENCOUNTER — Encounter: Payer: Self-pay | Admitting: Internal Medicine

## 2017-11-13 ENCOUNTER — Other Ambulatory Visit: Payer: Self-pay | Admitting: Internal Medicine

## 2017-11-13 DIAGNOSIS — D509 Iron deficiency anemia, unspecified: Secondary | ICD-10-CM

## 2017-11-13 MED ORDER — INTEGRA 62.5-62.5-40-3 MG PO CAPS: ORAL_CAPSULE | ORAL | 2 refills | Status: DC

## 2017-11-13 NOTE — Assessment & Plan Note (Signed)
On citalopram.  Declines need for further intervention.  Follow.

## 2017-11-13 NOTE — Assessment & Plan Note (Signed)
Diet and exercise.  Follow.  

## 2017-11-13 NOTE — Assessment & Plan Note (Signed)
Doing well on citalopram.  Discussed with her today.  Desires no further intervention.  Follow.

## 2017-11-13 NOTE — Assessment & Plan Note (Signed)
Continue diet and exercise.  Follow met b and a1c.   

## 2017-11-13 NOTE — Assessment & Plan Note (Signed)
In SR.  Stable.  Has been followed by cardiology.

## 2017-11-13 NOTE — Assessment & Plan Note (Signed)
Currently dong well.  No acid reflux. No swallowing problems.  Follow.

## 2017-11-13 NOTE — Assessment & Plan Note (Signed)
Recent hgb 11.3.  Iron low.  Is s/p gastric surgery.  Colonoscopy 12/2012.  Hemoccult cards given.  Recheck cbc.

## 2017-11-13 NOTE — Assessment & Plan Note (Signed)
On crestor.  Low cholesterol diet and exercise.  Follow lipid panel and liver function tests.   

## 2017-11-13 NOTE — Assessment & Plan Note (Signed)
No acid reflux or swallowing problems.  Follow.

## 2017-11-13 NOTE — Assessment & Plan Note (Signed)
Blood pressure on recheck wnl.  Has been under good control.  Follow pressures.  Follow metabolic panel.

## 2017-11-13 NOTE — Assessment & Plan Note (Signed)
Breathing stable.  Follow.    

## 2017-11-13 NOTE — Progress Notes (Signed)
Order placed for f/u cbc and ferritin.   

## 2017-11-14 ENCOUNTER — Other Ambulatory Visit: Payer: Self-pay | Admitting: Internal Medicine

## 2017-11-14 DIAGNOSIS — D649 Anemia, unspecified: Secondary | ICD-10-CM

## 2017-11-14 NOTE — Progress Notes (Signed)
Order placed for gastro referral 

## 2017-12-01 DIAGNOSIS — D2272 Melanocytic nevi of left lower limb, including hip: Secondary | ICD-10-CM | POA: Diagnosis not present

## 2017-12-01 DIAGNOSIS — D225 Melanocytic nevi of trunk: Secondary | ICD-10-CM | POA: Diagnosis not present

## 2017-12-01 DIAGNOSIS — D2262 Melanocytic nevi of left upper limb, including shoulder: Secondary | ICD-10-CM | POA: Diagnosis not present

## 2017-12-01 DIAGNOSIS — D2271 Melanocytic nevi of right lower limb, including hip: Secondary | ICD-10-CM | POA: Diagnosis not present

## 2017-12-01 DIAGNOSIS — D2261 Melanocytic nevi of right upper limb, including shoulder: Secondary | ICD-10-CM | POA: Diagnosis not present

## 2017-12-01 DIAGNOSIS — L821 Other seborrheic keratosis: Secondary | ICD-10-CM | POA: Diagnosis not present

## 2017-12-07 DIAGNOSIS — J449 Chronic obstructive pulmonary disease, unspecified: Secondary | ICD-10-CM | POA: Diagnosis not present

## 2017-12-11 DIAGNOSIS — D509 Iron deficiency anemia, unspecified: Secondary | ICD-10-CM | POA: Diagnosis not present

## 2017-12-19 ENCOUNTER — Other Ambulatory Visit: Payer: Self-pay

## 2017-12-29 ENCOUNTER — Other Ambulatory Visit (INDEPENDENT_AMBULATORY_CARE_PROVIDER_SITE_OTHER): Payer: PPO

## 2017-12-29 DIAGNOSIS — E7800 Pure hypercholesterolemia, unspecified: Secondary | ICD-10-CM | POA: Diagnosis not present

## 2017-12-29 DIAGNOSIS — E119 Type 2 diabetes mellitus without complications: Secondary | ICD-10-CM | POA: Diagnosis not present

## 2017-12-29 DIAGNOSIS — D649 Anemia, unspecified: Secondary | ICD-10-CM

## 2017-12-29 DIAGNOSIS — D509 Iron deficiency anemia, unspecified: Secondary | ICD-10-CM

## 2017-12-29 NOTE — Addendum Note (Signed)
Addended by: Arby Barrette on: 12/29/2017 12:43 PM   Modules accepted: Orders

## 2017-12-30 LAB — CBC WITH DIFFERENTIAL/PLATELET
BASOS ABS: 53 {cells}/uL (ref 0–200)
Basophils Relative: 0.8 %
Eosinophils Absolute: 198 cells/uL (ref 15–500)
Eosinophils Relative: 3 %
HEMATOCRIT: 34.7 % — AB (ref 35.0–45.0)
HEMOGLOBIN: 11.3 g/dL — AB (ref 11.7–15.5)
Lymphs Abs: 2152 cells/uL (ref 850–3900)
MCH: 26 pg — AB (ref 27.0–33.0)
MCHC: 32.6 g/dL (ref 32.0–36.0)
MCV: 80 fL (ref 80.0–100.0)
MPV: 11 fL (ref 7.5–12.5)
Monocytes Relative: 8.6 %
NEUTROS ABS: 3630 {cells}/uL (ref 1500–7800)
Neutrophils Relative %: 55 %
PLATELETS: 283 10*3/uL (ref 140–400)
RBC: 4.34 10*6/uL (ref 3.80–5.10)
RDW: 14.1 % (ref 11.0–15.0)
Total Lymphocyte: 32.6 %
WBC mixed population: 568 cells/uL (ref 200–950)
WBC: 6.6 10*3/uL (ref 3.8–10.8)

## 2017-12-30 LAB — BASIC METABOLIC PANEL
BUN: 23 mg/dL (ref 7–25)
CALCIUM: 9.1 mg/dL (ref 8.6–10.4)
CO2: 28 mmol/L (ref 20–32)
Chloride: 100 mmol/L (ref 98–110)
Creat: 0.84 mg/dL (ref 0.50–0.99)
GLUCOSE: 91 mg/dL (ref 65–99)
Potassium: 3.8 mmol/L (ref 3.5–5.3)
Sodium: 140 mmol/L (ref 135–146)

## 2017-12-30 LAB — HEPATIC FUNCTION PANEL
AG Ratio: 1.6 (calc) (ref 1.0–2.5)
ALT: 8 U/L (ref 6–29)
AST: 8 U/L — AB (ref 10–35)
Albumin: 3.8 g/dL (ref 3.6–5.1)
Alkaline phosphatase (APISO): 63 U/L (ref 33–130)
BILIRUBIN TOTAL: 0.3 mg/dL (ref 0.2–1.2)
Bilirubin, Direct: 0.1 mg/dL (ref 0.0–0.2)
Globulin: 2.4 g/dL (calc) (ref 1.9–3.7)
Indirect Bilirubin: 0.2 mg/dL (calc) (ref 0.2–1.2)
Total Protein: 6.2 g/dL (ref 6.1–8.1)

## 2017-12-30 LAB — FERRITIN: FERRITIN: 23 ng/mL (ref 20–288)

## 2017-12-30 LAB — LIPID PANEL
CHOL/HDL RATIO: 2.9 (calc) (ref <5.0)
Cholesterol: 176 mg/dL (ref <200)
HDL: 61 mg/dL (ref >50)
LDL CHOLESTEROL (CALC): 98 mg/dL
Non-HDL Cholesterol (Calc): 115 mg/dL (calc) (ref <130)
TRIGLYCERIDES: 83 mg/dL (ref <150)

## 2017-12-30 LAB — HEMOGLOBIN A1C
Hgb A1c MFr Bld: 6 % of total Hgb — ABNORMAL HIGH (ref <5.7)
MEAN PLASMA GLUCOSE: 126 (calc)
eAG (mmol/L): 7 (calc)

## 2018-01-06 DIAGNOSIS — J449 Chronic obstructive pulmonary disease, unspecified: Secondary | ICD-10-CM | POA: Diagnosis not present

## 2018-02-06 DIAGNOSIS — J449 Chronic obstructive pulmonary disease, unspecified: Secondary | ICD-10-CM | POA: Diagnosis not present

## 2018-02-08 ENCOUNTER — Encounter: Payer: Self-pay | Admitting: *Deleted

## 2018-02-09 ENCOUNTER — Ambulatory Visit: Payer: PPO | Admitting: Anesthesiology

## 2018-02-09 ENCOUNTER — Encounter: Payer: Self-pay | Admitting: *Deleted

## 2018-02-09 ENCOUNTER — Ambulatory Visit
Admission: RE | Admit: 2018-02-09 | Discharge: 2018-02-09 | Disposition: A | Discharge status: Home / Self Care | Payer: PPO | Source: Ambulatory Visit | Attending: Unknown Physician Specialty | Admitting: Unknown Physician Specialty

## 2018-02-09 ENCOUNTER — Encounter
Admission: RE | Disposition: A | Discharge status: Home / Self Care | Payer: Self-pay | Source: Ambulatory Visit | Attending: Unknown Physician Specialty

## 2018-02-09 DIAGNOSIS — Z79899 Other long term (current) drug therapy: Secondary | ICD-10-CM | POA: Diagnosis not present

## 2018-02-09 DIAGNOSIS — K2970 Gastritis, unspecified, without bleeding: Secondary | ICD-10-CM | POA: Diagnosis not present

## 2018-02-09 DIAGNOSIS — Z6841 Body Mass Index (BMI) 40.0 and over, adult: Secondary | ICD-10-CM | POA: Insufficient documentation

## 2018-02-09 DIAGNOSIS — Z7984 Long term (current) use of oral hypoglycemic drugs: Secondary | ICD-10-CM | POA: Insufficient documentation

## 2018-02-09 DIAGNOSIS — K219 Gastro-esophageal reflux disease without esophagitis: Secondary | ICD-10-CM | POA: Diagnosis not present

## 2018-02-09 DIAGNOSIS — K5790 Diverticulosis of intestine, part unspecified, without perforation or abscess without bleeding: Secondary | ICD-10-CM | POA: Diagnosis not present

## 2018-02-09 DIAGNOSIS — D123 Benign neoplasm of transverse colon: Secondary | ICD-10-CM | POA: Diagnosis not present

## 2018-02-09 DIAGNOSIS — D509 Iron deficiency anemia, unspecified: Secondary | ICD-10-CM | POA: Insufficient documentation

## 2018-02-09 DIAGNOSIS — E785 Hyperlipidemia, unspecified: Secondary | ICD-10-CM | POA: Diagnosis not present

## 2018-02-09 DIAGNOSIS — I10 Essential (primary) hypertension: Secondary | ICD-10-CM | POA: Insufficient documentation

## 2018-02-09 DIAGNOSIS — F329 Major depressive disorder, single episode, unspecified: Secondary | ICD-10-CM | POA: Insufficient documentation

## 2018-02-09 DIAGNOSIS — K5730 Diverticulosis of large intestine without perforation or abscess without bleeding: Secondary | ICD-10-CM | POA: Diagnosis not present

## 2018-02-09 DIAGNOSIS — J449 Chronic obstructive pulmonary disease, unspecified: Secondary | ICD-10-CM | POA: Diagnosis not present

## 2018-02-09 DIAGNOSIS — D126 Benign neoplasm of colon, unspecified: Secondary | ICD-10-CM | POA: Diagnosis not present

## 2018-02-09 DIAGNOSIS — K449 Diaphragmatic hernia without obstruction or gangrene: Secondary | ICD-10-CM | POA: Diagnosis not present

## 2018-02-09 DIAGNOSIS — K649 Unspecified hemorrhoids: Secondary | ICD-10-CM | POA: Diagnosis not present

## 2018-02-09 DIAGNOSIS — K635 Polyp of colon: Secondary | ICD-10-CM | POA: Diagnosis not present

## 2018-02-09 DIAGNOSIS — K640 First degree hemorrhoids: Secondary | ICD-10-CM | POA: Diagnosis not present

## 2018-02-09 DIAGNOSIS — E119 Type 2 diabetes mellitus without complications: Secondary | ICD-10-CM | POA: Diagnosis not present

## 2018-02-09 DIAGNOSIS — E7800 Pure hypercholesterolemia, unspecified: Secondary | ICD-10-CM | POA: Insufficient documentation

## 2018-02-09 HISTORY — DX: Calculus of kidney: N20.0

## 2018-02-09 HISTORY — DX: Allergy status to unspecified drugs, medicaments and biological substances: Z88.9

## 2018-02-09 HISTORY — DX: Hyperlipidemia, unspecified: E78.5

## 2018-02-09 HISTORY — PX: ESOPHAGOGASTRODUODENOSCOPY (EGD) WITH PROPOFOL: SHX5813

## 2018-02-09 HISTORY — DX: Personal history of other infectious and parasitic diseases: Z86.19

## 2018-02-09 HISTORY — PX: COLONOSCOPY WITH PROPOFOL: SHX5780

## 2018-02-09 LAB — GLUCOSE, CAPILLARY: GLUCOSE-CAPILLARY: 113 mg/dL — AB (ref 65–99)

## 2018-02-09 LAB — HM COLONOSCOPY

## 2018-02-09 SURGERY — ESOPHAGOGASTRODUODENOSCOPY (EGD) WITH PROPOFOL
Anesthesia: General

## 2018-02-09 MED ORDER — KETOROLAC TROMETHAMINE 60 MG/2ML IM SOLN
INTRAMUSCULAR | Status: AC
  Filled 2018-02-09: qty 2

## 2018-02-09 MED ORDER — SODIUM CHLORIDE 0.9 % IV SOLN: INTRAVENOUS | Status: DC

## 2018-02-09 MED ORDER — FENTANYL CITRATE (PF) 100 MCG/2ML IJ SOLN
12.5000 ug | Freq: Once | INTRAMUSCULAR | Status: AC
  Administered 2018-02-09: 12.5 ug via INTRAVENOUS

## 2018-02-09 MED ORDER — SODIUM CHLORIDE 0.9 % IV SOLN
INTRAVENOUS | Status: DC | PRN
  Administered 2018-02-09: 07:00:00 via INTRAVENOUS
  Administered 2018-02-09: 1000 mL

## 2018-02-09 MED ORDER — KETOROLAC TROMETHAMINE 30 MG/ML IJ SOLN
30.0000 mg | Freq: Once | INTRAMUSCULAR | Status: AC
  Administered 2018-02-09: 30 mg via INTRAVENOUS
  Filled 2018-02-09: qty 1

## 2018-02-09 MED ORDER — PROPOFOL 500 MG/50ML IV EMUL
INTRAVENOUS | Status: DC | PRN
  Administered 2018-02-09: 80 ug/kg/min via INTRAVENOUS

## 2018-02-09 MED ORDER — PROPOFOL 10 MG/ML IV BOLUS
INTRAVENOUS | Status: DC | PRN
  Administered 2018-02-09: 30 mg via INTRAVENOUS
  Administered 2018-02-09: 20 mg via INTRAVENOUS
  Administered 2018-02-09: 50 mg via INTRAVENOUS
  Administered 2018-02-09: 40 mg via INTRAVENOUS
  Administered 2018-02-09: 20 mg via INTRAVENOUS
  Administered 2018-02-09: 30 mg via INTRAVENOUS
  Administered 2018-02-09: 20 mg via INTRAVENOUS
  Administered 2018-02-09 (×2): 50 mg via INTRAVENOUS

## 2018-02-09 MED ORDER — FENTANYL CITRATE (PF) 100 MCG/2ML IJ SOLN
INTRAMUSCULAR | Status: AC
  Filled 2018-02-09: qty 2

## 2018-02-09 MED ORDER — PROPOFOL 500 MG/50ML IV EMUL
INTRAVENOUS | Status: AC
  Filled 2018-02-09: qty 50

## 2018-02-09 NOTE — Anesthesia Preprocedure Evaluation (Signed)
Anesthesia Evaluation  Patient identified by MRN, date of birth, ID band Patient awake    Reviewed: Allergy & Precautions, NPO status , Patient's Chart, lab work & pertinent test results  History of Anesthesia Complications Negative for: history of anesthetic complications  Airway Mallampati: II  TM Distance: >3 FB Neck ROM: Full    Dental  (+) Dental Advidsory Given   Pulmonary asthma , neg sleep apnea, COPD,  COPD inhaler,    breath sounds clear to auscultation- rhonchi (-) wheezing      Cardiovascular hypertension, Pt. on medications (-) CAD and (-) Past MI + Valvular Problems/Murmurs  Rhythm:Regular Rate:Normal - Systolic murmurs and - Diastolic murmurs    Neuro/Psych PSYCHIATRIC DISORDERS Depression negative neurological ROS     GI/Hepatic Neg liver ROS, GERD  ,  Endo/Other  diabetes, Well Controlled, Oral Hypoglycemic AgentsMorbid obesity  Renal/GU negative Renal ROS     Musculoskeletal  (+) Arthritis ,   Abdominal (+) + obese,   Peds  Hematology  (+) anemia ,   Anesthesia Other Findings Past Medical History: No date: Anemia No date: Arthritis No date: Asthma No date: COPD (chronic obstructive pulmonary disease) (* No date: Depression No date: Diabetes mellitus (HCC) No date: Environmental allergies No date: GERD (gastroesophageal reflux disease) No date: Heart murmur No date: Hypercholesterolemia No date: Hypertension No date: PSVT (paroxysmal supraventricular tachycardia)*   Reproductive/Obstetrics negative OB ROS                             Anesthesia Physical  Anesthesia Plan  ASA: III  Anesthesia Plan: General   Post-op Pain Management:    Induction: Intravenous  PONV Risk Score and Plan: 3 and Propofol infusion  Airway Management Planned: Nasal Cannula  Additional Equipment:   Intra-op Plan:   Post-operative Plan:   Informed Consent: I have reviewed  the patients History and Physical, chart, labs and discussed the procedure including the risks, benefits and alternatives for the proposed anesthesia with the patient or authorized representative who has indicated his/her understanding and acceptance.   Dental advisory given  Plan Discussed with: CRNA and Anesthesiologist  Anesthesia Plan Comments:         Anesthesia Quick Evaluation

## 2018-02-09 NOTE — Progress Notes (Signed)
Dr.Elliott and Dr. Andree Elk at bedside. Pt complaining of left shoulder pain, history of torn rotator cuff in left shoulder. Verbal orders from Dr. Andree Elk to give 30mg  of Toradol and 12.5 mg of fentanyl. Orders read back and confirmed will continue to assess.

## 2018-02-09 NOTE — H&P (Signed)
Primary Care Physician:  Einar Pheasant, MD Primary Gastroenterologist:  Dr. Vira Agar  Pre-Procedure History & Physical: HPI:  Glenda Zavala is a 67 y.o. female is here for an endoscopy and colonoscopy.  Done for iron def anemia.   Past Medical History:  Diagnosis Date  . Allergic genetic state   . Anemia   . Arthritis   . Asthma   . COPD (chronic obstructive pulmonary disease) ([place])   . Depression   . Diabetes mellitus ([place])   . Environmental allergies   . GERD (gastroesophageal reflux disease)   . Heart murmur   . History of chicken pox   . Hypercholesterolemia   . Hyperlipidemia   . Hypertension   . Kidney stones   . PSVT (paroxysmal supraventricular tachycardia) ([place])     Past Surgical History:  Procedure Laterality Date  . ABDOMINAL HYSTERECTOMY  1990   secondary to bleeding and hyperplasia  . CESAREAN SECTION    . CHOLECYSTECTOMY N/A 03/30/2016   Procedure: LAPAROSCOPIC CHOLECYSTECTOMY;  Surgeon: Florene Glen, MD;  Location: [facility] ORS;  Service: General;  Laterality: N/A;  . LAPAROSCOPIC GASTRIC BANDING  2010  . TONSILLECTOMY    . TUBAL LIGATION    . WISDOM TOOTH EXTRACTION      Prior to Admission medications   Medication Sig Start Date End Date Taking? Authorizing Provider  acetaminophen (TYLENOL) 650 MG CR tablet Take 650 mg by mouth every 8 (eight) hours as needed for pain.    Yes [provider]  Blood Glucose Monitoring Suppl (ONETOUCH VERIO IQ SYSTEM) W/DEVICE KIT AS DIRECTED 07/08/13  Yes Einar Pheasant, MD  citalopram (CELEXA) 40 MG tablet TAKE ONE (1) TABLET BY MOUTH EVERY DAY 11/10/17  Yes Einar Pheasant, MD  cyanocobalamin (,VITAMIN B-12,) 1000 MCG/ML injection Inject 1061mg q month 11/10/17  Yes SEinar Pheasant MD  Fe Fum-FePoly-Vit C-Vit B3 (INTEGRA) 62.5-62.5-40-3 MG CAPS One capsule daily 11/13/17  Yes SEinar Pheasant MD  hydrochlorothiazide (HYDRODIURIL) 25 MG tablet Take 1 tablet (25 mg total) by mouth daily. 11/10/17  Yes SEinar Pheasant MD  Lancets (ACCU-CHEK SOFT TOUCH) lancets Use as instructed 07/22/14  Yes SEinar Pheasant MD  lisinopril (PRINIVIL,ZESTRIL) 40 MG tablet TAKE ONE TABLET BY MOUTH EVERY MORNING FOR BLOOD PRESSURE 11/10/17  Yes SEinar Pheasant MD  metFORMIN (GLUCOPHAGE) 1000 MG tablet Take 1 tablet (1,000 mg total) by mouth 2 (two) times daily. 11/10/17  Yes SEinar Pheasant MD  Multiple Vitamin (MULTIVITAMIN WITH MINERALS) TABS tablet Take 1 tablet by mouth daily.   Yes [provider]  [facility] Centerpowder APPLY TO AFFECTED AREAS TWICE DAILY 03/20/17  Yes SEinar Pheasant MD  nystatin cream (MYCOSTATIN) APPLY TO AFFECTED AREAS TOPICALLY TWICE A DAY Patient taking differently: APPLY TO AFFECTED AREAS TOPICALLY TWICE A DAY AS NEEDED 09/02/14  Yes SEinar Pheasant MD  [facility] test strip TEST BLOOD GLUCOSE TWICE DAILY 08/09/16  Yes SEinar Pheasant MD  pantoprazole (PROTONIX) 40 MG tablet Take 40 mg by mouth daily.   Yes [provider]  rosuvastatin (CRESTOR) 5 MG tablet Take 1 tablet (5 mg total) by mouth at bedtime. 11/10/17  Yes SEinar Pheasant MD  Syringe/Needle, Disp, (SYRINGE 3CC/25GX1") 25G X 1" 3 ML MISC 1 application by Does not apply route once a week. 11/10/17  Yes SEinar Pheasant MD  triamcinolone cream (KENALOG) 0.1 % Apply 1 application topically 2 (two) times daily. 03/14/17  Yes SEinar Pheasant MD  verapamil (CALAN-SR) 240 MG CR tablet Take 1 tablet (240 mg total) by  mouth daily. 11/10/17  Yes Einar Pheasant, MD  albuterol (PROVENTIL) (2.5 MG/3ML) 0.083% nebulizer solution Take 3 mLs (2.5 mg total) by nebulization every 6 (six) hours as needed for shortness of breath. Patient not taking: Reported on 02/09/2018 04/17/15   Einar Pheasant, MD  fexofenadine (ALLEGRA) 180 MG tablet Take 180 mg by mouth as needed for allergies or rhinitis.    [provider]  fluticasone (FLONASE) 50 MCG/ACT nasal spray USE 2 SPRAYS EACH NOSTRIL DAILY Patient not taking: Reported on  02/09/2018 08/21/17   Einar Pheasant, MD  loratadine (CLARITIN) 10 MG tablet Take 10 mg by mouth daily.    [provider]  montelukast (SINGULAIR) 10 MG tablet Take 10 mg by mouth at bedtime as needed (allergies).     [provider]  phentermine 37.5 MG capsule Take by mouth. 06/08/17 07/08/17  [provider]  tiotropium (SPIRIVA HANDIHALER) 18 MCG inhalation capsule INHALE CONTENTS OF ONE CAPSULE ONCE A DAY AS DIRECTED Patient not taking: Reported on 02/09/2018 04/16/15   Einar Pheasant, MD  VENTOLIN HFA 108 (90 Base) MCG/ACT inhaler INHALE TWO PUFFS EVERY 6 HOURS AS NEEDEDWHEEZING OR SHORTNESS OF BREATH Patient not taking: Reported on 02/09/2018 10/17/16   Einar Pheasant, MD    Allergies as of 12/20/2017  . (No Known Allergies)    Family History  Problem Relation Age of Onset  . Hypercholesterolemia Mother   . Hyperlipidemia Mother   . Emphysema Maternal Grandfather   . Uterine cancer Unknown        aunt  . Coronary artery disease Father   . Hypertension Father   . Hyperlipidemia Father   . Emphysema Maternal Grandmother   . Breast cancer Neg Hx   . Colon cancer Neg Hx     Social History   Socioeconomic History  . Marital status: Married    Spouse name: Not on file  . Number of children: 2  . Years of education: Not on file  . Highest education level: Not on file  Occupational History  . Occupation: Pharmacist, hospital  Social Needs  . Financial resource strain: Not on file  . Food insecurity:    Worry: Not on file    Inability: Not on file  . Transportation needs:    Medical: Not on file    Non-medical: Not on file  Tobacco Use  . Smoking status: Never Smoker  . Smokeless tobacco: Never Used  Substance and Sexual Activity  . Alcohol use: No    Alcohol/week: 0.0 oz  . Drug use: No  . Sexual activity: Not on file    Comment: Married   Lifestyle  . Physical activity:    Days per week: Not on file    Minutes per session: Not on file  .  Stress: Not on file  Relationships  . Social connections:    Talks on phone: Not on file    Gets together: Not on file    Attends religious service: Not on file    Active member of club or organization: Not on file    Attends meetings of clubs or organizations: Not on file    Relationship status: Not on file  . Intimate partner violence:    Fear of current or ex partner: Not on file    Emotionally abused: Not on file    Physically abused: Not on file    Forced sexual activity: Not on file  Other Topics Concern  . Not on file  Social History Narrative  .  Not on file    Review of Systems: See HPI, otherwise negative ROS  Physical Exam: BP (!) 127/52   Pulse (!) 53   Temp (!) 96.4 F (35.8 C) (Tympanic)   Resp 18   Ht 5' 3" (1.6 m)   Wt 117.9 kg (260 lb)   SpO2 100%   BMI 46.06 kg/m  General:   Alert,  pleasant and cooperative in NAD Head:  Normocephalic and atraumatic. Neck:  Supple; no masses or thyromegaly. Lungs:  Clear throughout to auscultation.    Heart:  Regular rate and rhythm. Abdomen:  Soft, nontender and nondistended. Normal bowel sounds, without guarding, and without rebound.  Obese, previous gastric surgery. Neurologic:  Alert and  oriented x4;  grossly normal neurologically.  Impression/Plan: SYLVIA HELMS is here for an endoscopy and colonoscopy to be performed for iron def anemia  Risks, benefits, limitations, and alternatives regarding  endoscopy and colonoscopy have been reviewed with the patient.  Questions have been answered.  All parties agreeable.   Gaylyn Cheers, MD  02/09/2018, 7:43 AM

## 2018-02-09 NOTE — Op Note (Signed)
[facility] Gastroenterology Patient Name: Glenda Zavala Procedure Date: 02/09/2018 7:44 AM MRN: [id number] Account #: [id number] Date of Birth: 17-Apr-1951 Admit Type: Outpatient Age: 67 Room: [facility] ENDO ROOM 1 Gender: Female Note Status: Finalized Procedure:            Colonoscopy Indications:          Unexplained iron deficiency anemia Providers:            Manya Silvas, MD Referring MD:         Einar Pheasant, MD (Referring MD) Medicines:            Propofol per Anesthesia Complications:        No immediate complications. Procedure:            Pre-Anesthesia Assessment:                       - After reviewing the risks and benefits, the patient                        was deemed in satisfactory condition to undergo the                        procedure.                       After obtaining informed consent, the colonoscope was                        passed under direct vision. Throughout the procedure,                        the patient's blood pressure, pulse, and oxygen                        saturations were monitored continuously. The                        Colonoscope was introduced through the anus and                        advanced to the the cecum, identified by appendiceal                        orifice and ileocecal valve. The colonoscopy was                        performed without difficulty. The patient tolerated the                        procedure well. The quality of the bowel preparation                        was good. Findings:      A diminutive polyp was found in the transverse colon. The polyp was       sessile. The polyp was removed with a jumbo cold forceps. Resection and       retrieval were complete.      Multiple medium-mouthed diverticula were found in the sigmoid colon,       descending colon, transverse colon and ascending colon.      Internal hemorrhoids were found during endoscopy. The  hemorrhoids were       small and Grade  I (internal hemorrhoids that do not prolapse).      The exam was otherwise without abnormality. Impression:           - One diminutive polyp in the transverse colon, removed                        with a jumbo cold forceps. Resected and retrieved.                       - Diverticulosis in the sigmoid colon, in the                        descending colon, in the transverse colon and in the                        ascending colon.                       - Internal hemorrhoids.                       - The examination was otherwise normal. Recommendation:       - Await pathology results. Manya Silvas, MD 02/09/2018 8:25:15 AM This report has been signed electronically. Number of Addenda: 0 Note Initiated On: 02/09/2018 7:44 AM Scope Withdrawal Time: 0 hours 11 minutes 1 second  Total Procedure Duration: 0 hours 21 minutes 35 seconds       [facility]

## 2018-02-09 NOTE — Transfer of Care (Signed)
Immediate Anesthesia Transfer of Care Note  Patient: Glenda Zavala  Procedure(s) Performed: ESOPHAGOGASTRODUODENOSCOPY (EGD) WITH PROPOFOL (N/A ) COLONOSCOPY WITH PROPOFOL (N/A )  Patient Location: PACU  Anesthesia Type:General  Level of Consciousness: awake  Airway & Oxygen Therapy: Patient Spontanous Breathing  Post-op Assessment: Report given to RN  Post vital signs: stable  Last Vitals:  Vitals Value Taken Time  BP    Temp 37 C 02/09/2018  8:27 AM  Pulse    Resp    SpO2      Last Pain:  Vitals:   02/09/18 0711  TempSrc: Tympanic  PainSc: 0-No pain         Complications: No apparent anesthesia complications

## 2018-02-09 NOTE — Anesthesia Post-op Follow-up Note (Signed)
Anesthesia QCDR form completed.        

## 2018-02-09 NOTE — Op Note (Signed)
[facility] Gastroenterology Patient Name: Glenda Zavala Procedure Date: 02/09/2018 7:45 AM MRN: [id number] Account #: [id number] Date of Birth: 04-20-1951 Admit Type: Outpatient Age: 67 Room: [facility] ENDO ROOM 1 Gender: Female Note Status: Finalized Procedure:            Upper GI endoscopy Indications:          Unexplained iron deficiency anemia Providers:            Manya Silvas, MD Referring MD:         Einar Pheasant, MD (Referring MD) Medicines:            Propofol per Anesthesia Complications:        No immediate complications. Procedure:            Pre-Anesthesia Assessment:                       - After reviewing the risks and benefits, the patient                        was deemed in satisfactory condition to undergo the                        procedure.                       After obtaining informed consent, the endoscope was                        passed under direct vision. Throughout the procedure,                        the patient's blood pressure, pulse, and oxygen                        saturations were monitored continuously. The Endoscope                        was introduced through the mouth, and advanced to the                        second part of duodenum. The upper GI endoscopy was                        accomplished without difficulty. The patient tolerated                        the procedure well. Findings:      The examined esophagus was normal. GEJ 34 cm.      A small hiatal hernia was present.      Localized minimal inflammation characterized by a small area of erythema       and granularity was found in the gastric body. The stomach exam showed       that the gastric band was in place. Clear demarcation between proximal       and distal stomach clear seen.      The examined duodenum was normal. Impression:           - Normal esophagus.                       - Small hiatal hernia.                       -  Gastritis.            - Normal examined duodenum.                       - No specimens collected. Recommendation:       - Perform a colonoscopy as previously scheduled. Manya Silvas, MD 02/09/2018 7:58:48 AM This report has been signed electronically. Number of Addenda: 0 Note Initiated On: 02/09/2018 7:45 AM      [facility]

## 2018-02-11 NOTE — Progress Notes (Signed)
Non-identifying voicemail.  No message left.

## 2018-02-12 LAB — SURGICAL PATHOLOGY

## 2018-02-20 NOTE — Anesthesia Postprocedure Evaluation (Signed)
Anesthesia Post Note  Patient: ALAUNA HAYDEN  Procedure(s) Performed: ESOPHAGOGASTRODUODENOSCOPY (EGD) WITH PROPOFOL (N/A ) COLONOSCOPY WITH PROPOFOL (N/A )  Patient location during evaluation: PACU Anesthesia Type: General Level of consciousness: awake and alert Pain management: pain level controlled Vital Signs Assessment: post-procedure vital signs reviewed and stable Respiratory status: spontaneous breathing, nonlabored ventilation, respiratory function stable and patient connected to nasal cannula oxygen Cardiovascular status: blood pressure returned to baseline and stable Postop Assessment: no apparent nausea or vomiting Anesthetic complications: no     Last Vitals:  Vitals:   02/09/18 0845 02/09/18 0855  BP: (!) 145/88 131/71  Pulse: (!) 49 (!) 54  Resp: 11 12  Temp:    SpO2: 100% 100%    Last Pain:  Vitals:   02/09/18 0855  TempSrc:   PainSc: 1                  Molli Barrows

## 2018-03-08 DIAGNOSIS — J449 Chronic obstructive pulmonary disease, unspecified: Secondary | ICD-10-CM | POA: Diagnosis not present

## 2018-03-12 DIAGNOSIS — D509 Iron deficiency anemia, unspecified: Secondary | ICD-10-CM | POA: Diagnosis not present

## 2018-04-08 DIAGNOSIS — J449 Chronic obstructive pulmonary disease, unspecified: Secondary | ICD-10-CM | POA: Diagnosis not present

## 2018-04-25 DIAGNOSIS — I480 Paroxysmal atrial fibrillation: Secondary | ICD-10-CM | POA: Diagnosis not present

## 2018-04-25 DIAGNOSIS — E119 Type 2 diabetes mellitus without complications: Secondary | ICD-10-CM | POA: Diagnosis not present

## 2018-04-25 DIAGNOSIS — E7800 Pure hypercholesterolemia, unspecified: Secondary | ICD-10-CM | POA: Diagnosis not present

## 2018-04-25 DIAGNOSIS — I10 Essential (primary) hypertension: Secondary | ICD-10-CM | POA: Diagnosis not present

## 2018-04-25 DIAGNOSIS — I493 Ventricular premature depolarization: Secondary | ICD-10-CM | POA: Diagnosis not present

## 2018-04-27 ENCOUNTER — Ambulatory Visit (INDEPENDENT_AMBULATORY_CARE_PROVIDER_SITE_OTHER): Payer: PPO

## 2018-04-27 VITALS — BP 128/64 | HR 56 | Temp 98.5°F | Resp 16 | Ht 61.5 in | Wt 267.4 lb

## 2018-04-27 DIAGNOSIS — Z0000 Encounter for general adult medical examination without abnormal findings: Secondary | ICD-10-CM | POA: Diagnosis not present

## 2018-04-27 DIAGNOSIS — E119 Type 2 diabetes mellitus without complications: Secondary | ICD-10-CM | POA: Diagnosis not present

## 2018-04-27 DIAGNOSIS — Z1159 Encounter for screening for other viral diseases: Secondary | ICD-10-CM

## 2018-04-27 LAB — MICROALBUMIN / CREATININE URINE RATIO
Creatinine,U: 120.3 mg/dL
MICROALB/CREAT RATIO: 0.6 mg/g (ref 0.0–30.0)
Microalb, Ur: <0.7 mg/dL (ref 0.0–1.9)

## 2018-04-27 NOTE — Patient Instructions (Addendum)
  Ms. Dazey , Thank you for taking time to come for your Medicare Wellness Visit. I appreciate your ongoing commitment to your health goals. Please review the following plan we discussed and let me know if I can assist you in the future.   Follow up as needed.    Bring a copy of your [place] and/or Living Will to be scanned into chart once completed.   Have a great day!  These are the goals we discussed: Goals    . Healthy Lifestyle     Low cholesterol diet  Exercise        This is a list of the screening recommended for you and due dates:  Health Maintenance  Topic Date Due  . HIV Screening  07/11/1968  . Complete foot exam   05/24/2014  . Flu Shot  03/22/2018  . Eye exam for diabetics  06/07/2018  . Hemoglobin A1C  07/01/2018  . Mammogram  06/02/2019  . Pap Smear  06/14/2019  . Tetanus Vaccine  02/04/2027  . Colon Cancer Screening  02/10/2028  .  Hepatitis C: One time screening is recommended by Center for Disease Control  (CDC) for  adults born from [birth year] through 1963.   Completed

## 2018-04-27 NOTE — Addendum Note (Signed)
Addended by: Leeanne Rio on: 04/27/2018 10:19 AM   Modules accepted: Orders

## 2018-04-27 NOTE — Progress Notes (Signed)
Subjective:   Glenda Zavala is a 67 y.o. female who presents for an Initial Medicare Annual Wellness Visit.  Review of Systems    No ROS.  Medicare Wellness Visit. Additional risk factors are reflected in the social history.  Cardiac Risk Factors include: advanced age (>60mn, >>94women);hypertension;diabetes mellitus;obesity (BMI >30kg/m2)     Objective:    Today's Vitals   04/27/18 0927  BP: 128/64  Pulse: (!) 56  Resp: 16  Temp: 98.5 F (36.9 C)  TempSrc: Oral  SpO2: 98%  Weight: 267 lb 6.4 oz (121.3 kg)  Height: 5' 1.5" (1.562 m)   Body mass index is 49.71 kg/m.  Advanced Directives 04/27/2018 02/09/2018 03/30/2016 03/21/2016  Does Patient Have a Medical Advance Directive? No No Yes Yes  Does patient want to make changes to medical advance directive? Yes (MAU/Ambulatory/Procedural Areas - Information given) - No - Patient declined No - Patient declined    Current Medications (verified) Outpatient Encounter Medications as of 04/27/2018  Medication Sig  . Blood Glucose Monitoring Suppl (ONETOUCH VERIO IQ SYSTEM) W/DEVICE KIT AS DIRECTED  . citalopram (CELEXA) 40 MG tablet TAKE ONE (1) TABLET BY MOUTH EVERY DAY  . cyanocobalamin (,VITAMIN B-12,) 1000 MCG/ML injection Inject 10029m q month  . Fe Fum-FePoly-Vit C-Vit B3 (INTEGRA) 62.5-62.5-40-3 MG CAPS One capsule daily  . fexofenadine (ALLEGRA) 180 MG tablet Take 180 mg by mouth as needed for allergies or rhinitis.  . fluticasone (FLONASE) 50 MCG/ACT nasal spray USE 2 SPRAYS EACH NOSTRIL DAILY  . hydrochlorothiazide (HYDRODIURIL) 25 MG tablet Take 1 tablet (25 mg total) by mouth daily.  . Lancets (ACCU-CHEK SOFT TOUCH) lancets Use as instructed  . lisinopril (PRINIVIL,ZESTRIL) 40 MG tablet TAKE ONE TABLET BY MOUTH EVERY MORNING FOR BLOOD PRESSURE  . loratadine (CLARITIN) 10 MG tablet Take 10 mg by mouth daily.  . metFORMIN (GLUCOPHAGE) 1000 MG tablet Take 1 tablet (1,000 mg total) by mouth 2 (two) times daily.  .  montelukast (SINGULAIR) 10 MG tablet Take 10 mg by mouth at bedtime as needed (allergies).   . Multiple Vitamin (MULTIVITAMIN WITH MINERALS) TABS tablet Take 1 tablet by mouth daily.  . Marland KitchenYAMYC powder APPLY TO AFFECTED AREAS TWICE DAILY  . nystatin cream (MYCOSTATIN) APPLY TO AFFECTED AREAS TOPICALLY TWICE A DAY (Patient taking differently: APPLY TO AFFECTED AREAS TOPICALLY TWICE A DAY AS NEEDED)  . ONETOUCH VERIO test strip TEST BLOOD GLUCOSE TWICE DAILY  . pantoprazole (PROTONIX) 40 MG tablet Take 40 mg by mouth daily.  . rosuvastatin (CRESTOR) 5 MG tablet Take 1 tablet (5 mg total) by mouth at bedtime.  . Syringe/Needle, Disp, (SYRINGE 3CC/25GX1") 25G X 1" 3 ML MISC 1 application by Does not apply route once a week.  . tiotropium (SPIRIVA HANDIHALER) 18 MCG inhalation capsule INHALE CONTENTS OF ONE CAPSULE ONCE A DAY AS DIRECTED  . triamcinolone cream (KENALOG) 0.1 % Apply 1 application topically 2 (two) times daily.  . VENTOLIN HFA 108 (90 Base) MCG/ACT inhaler INHALE TWO PUFFS EVERY 6 HOURS AS NEEDEDWHEEZING OR SHORTNESS OF BREATH  . verapamil (CALAN-SR) 240 MG CR tablet Take 1 tablet (240 mg total) by mouth daily.  . [DISCONTINUED] acetaminophen (TYLENOL) 650 MG CR tablet Take 650 mg by mouth every 8 (eight) hours as needed for pain.   . Marland Kitchenlbuterol (PROVENTIL) (2.5 MG/3ML) 0.083% nebulizer solution Take 3 mLs (2.5 mg total) by nebulization every 6 (six) hours as needed for shortness of breath. (Patient not taking: Reported on 04/27/2018)  . phentermine 37.5  MG capsule Take by mouth.   No facility-administered encounter medications on file as of 04/27/2018.     Allergies (verified) Patient has no known allergies.   History: Past Medical History:  Diagnosis Date  . Allergic genetic state   . Anemia   . Arthritis   . Asthma   . COPD (chronic obstructive pulmonary disease) ([place])   . Depression   . Diabetes mellitus ([place])   . Environmental allergies   . GERD (gastroesophageal reflux  disease)   . Heart murmur   . History of chicken pox   . Hypercholesterolemia   . Hyperlipidemia   . Hypertension   . Kidney stones   . PSVT (paroxysmal supraventricular tachycardia) ([place])    Past Surgical History:  Procedure Laterality Date  . ABDOMINAL HYSTERECTOMY  1990   secondary to bleeding and hyperplasia  . CESAREAN SECTION    . CHOLECYSTECTOMY N/A 03/30/2016   Procedure: LAPAROSCOPIC CHOLECYSTECTOMY;  Surgeon: Glenda Glen, MD;  Location: [facility] ORS;  Service: General;  Laterality: N/A;  . COLONOSCOPY WITH PROPOFOL N/A 02/09/2018   Procedure: COLONOSCOPY WITH PROPOFOL;  Surgeon: Glenda Silvas, MD;  Location: [facility] ENDOSCOPY;  Service: Endoscopy;  Laterality: N/A;  . ESOPHAGOGASTRODUODENOSCOPY (EGD) WITH PROPOFOL N/A 02/09/2018   Procedure: ESOPHAGOGASTRODUODENOSCOPY (EGD) WITH PROPOFOL;  Surgeon: Glenda Silvas, MD;  Location: [facility] ENDOSCOPY;  Service: Endoscopy;  Laterality: N/A;  . LAPAROSCOPIC GASTRIC BANDING  2010  . TONSILLECTOMY    . TUBAL LIGATION    . WISDOM TOOTH EXTRACTION     Family History  Problem Relation Age of Onset  . Hypercholesterolemia Mother   . Hyperlipidemia Mother   . Aortic dissection Mother   . Emphysema Maternal Grandfather   . Uterine cancer Unknown        aunt  . Coronary artery disease Father   . Hypertension Father   . Hyperlipidemia Father   . Emphysema Maternal Grandmother   . Mental illness Daughter   . Breast cancer Neg Hx   . Colon cancer Neg Hx    Social History   Socioeconomic History  . Marital status: Married    Spouse name: Not on file  . Number of children: 2  . Years of education: Not on file  . Highest education level: Not on file  Occupational History  . Occupation: Pharmacist, hospital  Social Needs  . Financial resource strain: Not hard at all  . Food insecurity:    Worry: Never true    Inability: Never true  . Transportation needs:    Medical: No    Non-medical: No  Tobacco Use  . Smoking status: Never Smoker    . Smokeless tobacco: Never Used  Substance and Sexual Activity  . Alcohol use: No    Alcohol/week: 0.0 standard drinks  . Drug use: No  . Sexual activity: Not on file    Comment: Married   Lifestyle  . Physical activity:    Days per week: 3 days    Minutes per session: 60 min  . Stress: Not at all  Relationships  . Social connections:    Talks on phone: Not on file    Gets together: Not on file    Attends religious service: Not on file    Active member of club or organization: Not on file    Attends meetings of clubs or organizations: Not on file    Relationship status: Not on file  Other Topics Concern  . Not on file  Social History Narrative  .  Not on file    Tobacco Counseling Counseling given: Not Answered   Clinical Intake:  Pre-visit preparation completed: Yes  Pain : No/denies pain     Nutritional Status: BMI > 30  Obese Diabetes: Yes(Followed by pcp)  How often do you need to have someone help you when you read instructions, pamphlets, or other written materials from your doctor or pharmacy?: 1 - Never  Interpreter Needed?: No      Activities of Daily Living In your present state of health, do you have any difficulty performing the following activities: 04/27/2018  Hearing? N  Vision? N  Difficulty concentrating or making decisions? N  Walking or climbing stairs? Y  Comment R knee pain, intermittent. No pain today.  Dressing or bathing? N  Doing errands, shopping? N  Preparing Food and eating ? N  Using the Toilet? N  In the past six months, have you accidently leaked urine? N  Do you have problems with loss of bowel control? N  Managing your Medications? N  Managing your Finances? N  Housekeeping or managing your Housekeeping? N  Some recent data might be hidden     Immunizations and Health Maintenance Immunization History  Administered Date(s) Administered  . Influenza Split 06/23/2014  . Influenza,inj,Quad PF,6+ Mos 05/24/2013  .  Tdap 02/03/2017  . Zoster 08/13/2013  . Zoster Recombinat (Shingrix) 02/03/2017   Health Maintenance Due  Topic Date Due  . HIV Screening  07/11/1968  . FOOT EXAM  05/24/2014  . INFLUENZA VACCINE  03/22/2018    Patient Care Team: Einar Pheasant, MD as PCP - General (Internal Medicine)  Indicate any recent Medical Services you may have received from other than [facility] providers in the past year (date may be approximate).     Assessment:   This is a routine wellness examination for Glenda Zavala.  The goal of the wellness visit is to assist the patient how to close the gaps in care and create a preventative care plan for the patient.   The roster of all physicians providing medical care to patient is listed in the Snapshot section of the chart.  Osteoporosis risk reviewed.    Safety issues reviewed; Smoke and carbon monoxide detectors in the home. No firearms in the home. Wears seatbelts when driving or riding with others. No violence in the home.  They do not have excessive sun exposure.  Discussed the need for sun protection: hats, long sleeves and the use of sunscreen if there is significant sun exposure.  Patient is alert, normal appearance, oriented to person/place/and time.  Correctly identified the president of the [place] and recalls of 3/3 words. Performs simple calculations and can read correct time from watch face.  Displays appropriate judgement.  No new identified risk were noted.  No failures at ADL's or IADL's.  Ambulates with cane.  BMI- discussed the importance of a healthy diet, water intake and the benefits of aerobic exercise. Educational material provided.   24 hour diet recall: Soup.   Premier protein supplemental drink.  Dental- UTD. Dr. Dallas Schimke.  Eye- Visual acuity not assessed per patient preference since they have regular follow up with the ophthalmologist.  Wears corrective lenses.  Sleep patterns- Sleeps without issues.   Hep C screening consent given.     Labs completed.   Handicap form provided per request.  Glucometer monitor provided per request; mistakenly left behind. Notified via phone message it has been placed in front office. To be returned to stock until follow up with  pcp if not picked up today.  Patient Concerns: Requests weight loss Rx.  Appointment scheduled with pcp for follow up.   Hearing/Vision screen Hearing Screening Comments: Patient is able to hear conversational tones without difficulty.  No issues reported.   Vision Screening Comments: Followed by [facility] Wears corrective lenses Last OV 11/2017 No retinopathy reported Visual acuity not assessed per patient preference since they have regular follow up with the ophthalmologist  Dietary issues and exercise activities discussed: Current Exercise Habits: Home exercise routine, Time (Minutes): 60, Frequency (Times/Week): 3, Weekly Exercise (Minutes/Week): 180, Intensity: Mild  Goals    . Healthy Lifestyle     Low cholesterol diet  Exercise       Depression Screen PHQ 2/9 Scores 04/27/2018 06/15/2017 06/13/2016 04/16/2015  PHQ - 2 Score 0 0 0 2  PHQ- 9 Score - 0 - 7    Fall Risk Fall Risk  04/27/2018 06/15/2017 06/13/2016 04/16/2015  Falls in the past year? No No No No   Cognitive Function: MMSE - Mini Mental State Exam 04/27/2018  Orientation to time 5  Orientation to Place 5  Registration 3  Attention/ Calculation 5  Recall 3  Language- name 2 objects 2  Language- repeat 1  Language- follow 3 step command 3  Language- read & follow direction 1  Write a sentence 1  Copy design 1  Total score 30        Screening Tests Health Maintenance  Topic Date Due  . HIV Screening  07/11/1968  . FOOT EXAM  05/24/2014  . INFLUENZA VACCINE  03/22/2018  . OPHTHALMOLOGY EXAM  06/07/2018  . HEMOGLOBIN A1C  07/01/2018  . MAMMOGRAM  06/02/2019  . PAP SMEAR  06/14/2019  . TETANUS/TDAP  02/04/2027  . COLONOSCOPY  02/10/2028  . Hepatitis C Screening   Completed      Plan:    End of life planning; Advance aging; Advanced directives discussed. Copy of current HCPOA/Living Will requested.    I have personally reviewed and noted the following in the patient's chart:   . Medical and social history . Use of alcohol, tobacco or illicit drugs  . Current medications and supplements . Functional ability and status . Nutritional status . Physical activity . Advanced directives . List of other physicians . Hospitalizations, surgeries, and ER visits in previous 12 months . Vitals . Screenings to include cognitive, depression, and falls . Referrals and appointments  In addition, I have reviewed and discussed with patient certain preventive protocols, quality metrics, and best practice recommendations. A written personalized care plan for preventive services as well as general preventive health recommendations were provided to patient.     Varney Biles, LPN   08/27/1094    Reviewed above information.  Agree with assessment and plan.  Scheduled appt to follow up with me regarding weight loss.    Dr Nicki Reaper

## 2018-04-28 LAB — HEPATITIS C ANTIBODY
Hepatitis C Ab: NONREACTIVE
SIGNAL TO CUT-OFF: 0.01 (ref <1.00)

## 2018-04-30 ENCOUNTER — Encounter: Payer: Self-pay | Admitting: Internal Medicine

## 2018-05-08 ENCOUNTER — Ambulatory Visit (INDEPENDENT_AMBULATORY_CARE_PROVIDER_SITE_OTHER): Payer: PPO | Admitting: Internal Medicine

## 2018-05-08 ENCOUNTER — Encounter: Payer: Self-pay | Admitting: Internal Medicine

## 2018-05-08 DIAGNOSIS — J4520 Mild intermittent asthma, uncomplicated: Secondary | ICD-10-CM

## 2018-05-08 DIAGNOSIS — E119 Type 2 diabetes mellitus without complications: Secondary | ICD-10-CM

## 2018-05-08 DIAGNOSIS — E7800 Pure hypercholesterolemia, unspecified: Secondary | ICD-10-CM | POA: Diagnosis not present

## 2018-05-08 DIAGNOSIS — K219 Gastro-esophageal reflux disease without esophagitis: Secondary | ICD-10-CM

## 2018-05-08 DIAGNOSIS — I4892 Unspecified atrial flutter: Secondary | ICD-10-CM

## 2018-05-08 DIAGNOSIS — D649 Anemia, unspecified: Secondary | ICD-10-CM | POA: Diagnosis not present

## 2018-05-08 DIAGNOSIS — I10 Essential (primary) hypertension: Secondary | ICD-10-CM | POA: Diagnosis not present

## 2018-05-08 DIAGNOSIS — F329 Major depressive disorder, single episode, unspecified: Secondary | ICD-10-CM | POA: Diagnosis not present

## 2018-05-08 DIAGNOSIS — Z6841 Body Mass Index (BMI) 40.0 and over, adult: Secondary | ICD-10-CM | POA: Diagnosis not present

## 2018-05-08 DIAGNOSIS — I4891 Unspecified atrial fibrillation: Secondary | ICD-10-CM | POA: Diagnosis not present

## 2018-05-08 DIAGNOSIS — F32A Depression, unspecified: Secondary | ICD-10-CM

## 2018-05-08 DIAGNOSIS — Z23 Encounter for immunization: Secondary | ICD-10-CM | POA: Diagnosis not present

## 2018-05-08 NOTE — Progress Notes (Signed)
Patient ID: Glenda Zavala, female   DOB: [birth year], 67 y.o.   MRN: [id number]   Subjective:    Patient ID: Glenda Zavala, female    DOB: [birth year], 67 y.o.   MRN: [id number]  HPI  Patient here as a work in to discuss weight loss medication.  We discussed diet and exercise.  She requested to restart phentermine.  Her bariatric surgeon had her on this previously.  Discussed other options.  She has elevated sugars.  Discussed saxenda(victoza).  She was in agreement to try this.  No chest pain.  No sob.  No acid reflux. No abdominal pain.  Bowels moving.  No urine change.  Increased stress.  Her mother passed away.  Her friend in still in jail in another country.  Discussed with her today.  Overall she feels she is handling things relatively well.  Blood pressure doing well.     Past Medical History:  Diagnosis Date  . Allergic genetic state   . Anemia   . Arthritis   . Asthma   . COPD (chronic obstructive pulmonary disease) ([place])   . Depression   . Diabetes mellitus ([place])   . Environmental allergies   . GERD (gastroesophageal reflux disease)   . Heart murmur   . History of chicken pox   . Hypercholesterolemia   . Hyperlipidemia   . Hypertension   . Kidney stones   . PSVT (paroxysmal supraventricular tachycardia) ([place])    Past Surgical History:  Procedure Laterality Date  . ABDOMINAL HYSTERECTOMY  1990   secondary to bleeding and hyperplasia  . CESAREAN SECTION    . CHOLECYSTECTOMY N/A 03/30/2016   Procedure: LAPAROSCOPIC CHOLECYSTECTOMY;  Surgeon: Florene Glen, MD;  Location: [facility] ORS;  Service: General;  Laterality: N/A;  . COLONOSCOPY WITH PROPOFOL N/A 02/09/2018   Procedure: COLONOSCOPY WITH PROPOFOL;  Surgeon: Manya Silvas, MD;  Location: [facility] ENDOSCOPY;  Service: Endoscopy;  Laterality: N/A;  . ESOPHAGOGASTRODUODENOSCOPY (EGD) WITH PROPOFOL N/A 02/09/2018   Procedure: ESOPHAGOGASTRODUODENOSCOPY (EGD) WITH PROPOFOL;  Surgeon: Manya Silvas, MD;  Location: [facility]  ENDOSCOPY;  Service: Endoscopy;  Laterality: N/A;  . LAPAROSCOPIC GASTRIC BANDING  2010  . TONSILLECTOMY    . TUBAL LIGATION    . WISDOM TOOTH EXTRACTION     Family History  Problem Relation Age of Onset  . Hypercholesterolemia Mother   . Hyperlipidemia Mother   . Aortic dissection Mother   . Emphysema Maternal Grandfather   . Uterine cancer Unknown        aunt  . Coronary artery disease Father   . Hypertension Father   . Hyperlipidemia Father   . Emphysema Maternal Grandmother   . Mental illness Daughter   . Breast cancer Neg Hx   . Colon cancer Neg Hx    Social History   Socioeconomic History  . Marital status: Married    Spouse name: Not on file  . Number of children: 2  . Years of education: Not on file  . Highest education level: Not on file  Occupational History  . Occupation: Pharmacist, hospital  Social Needs  . Financial resource strain: Not hard at all  . Food insecurity:    Worry: Never true    Inability: Never true  . Transportation needs:    Medical: No    Non-medical: No  Tobacco Use  . Smoking status: Never Smoker  . Smokeless tobacco: Never Used  Substance and Sexual Activity  . Alcohol use: No    Alcohol/week: 0.0 standard  drinks  . Drug use: No  . Sexual activity: Not on file    Comment: Married   Lifestyle  . Physical activity:    Days per week: 3 days    Minutes per session: 60 min  . Stress: Not at all  Relationships  . Social connections:    Talks on phone: Not on file    Gets together: Not on file    Attends religious service: Not on file    Active member of club or organization: Not on file    Attends meetings of clubs or organizations: Not on file    Relationship status: Not on file  Other Topics Concern  . Not on file  Social History Narrative  . Not on file    Outpatient Encounter Medications as of 05/08/2018  Medication Sig  . albuterol (PROVENTIL) (2.5 MG/3ML) 0.083% nebulizer solution Take 3 mLs (2.5 mg total) by nebulization every  6 (six) hours as needed for shortness of breath. (Patient not taking: Reported on 04/27/2018)  . Blood Glucose Monitoring Suppl (ONETOUCH VERIO IQ SYSTEM) W/DEVICE KIT AS DIRECTED  . citalopram (CELEXA) 40 MG tablet TAKE ONE (1) TABLET BY MOUTH EVERY DAY  . cyanocobalamin (,VITAMIN B-12,) 1000 MCG/ML injection Inject 1000mcg q month  . Fe Fum-FePoly-Vit C-Vit B3 (INTEGRA) 62.5-62.5-40-3 MG CAPS One capsule daily  . fexofenadine (ALLEGRA) 180 MG tablet Take 180 mg by mouth as needed for allergies or rhinitis.  . fluticasone (FLONASE) 50 MCG/ACT nasal spray USE 2 SPRAYS EACH NOSTRIL DAILY  . hydrochlorothiazide (HYDRODIURIL) 25 MG tablet Take 1 tablet (25 mg total) by mouth daily.  . Lancets (ACCU-CHEK SOFT TOUCH) lancets Use as instructed  . lisinopril (PRINIVIL,ZESTRIL) 40 MG tablet TAKE ONE TABLET BY MOUTH EVERY MORNING FOR BLOOD PRESSURE  . loratadine (CLARITIN) 10 MG tablet Take 10 mg by mouth daily.  . metFORMIN (GLUCOPHAGE) 1000 MG tablet Take 1 tablet (1,000 mg total) by mouth 2 (two) times daily.  . montelukast (SINGULAIR) 10 MG tablet Take 10 mg by mouth at bedtime as needed (allergies).   . Multiple Vitamin (MULTIVITAMIN WITH MINERALS) TABS tablet Take 1 tablet by mouth daily.  . NYAMYC powder APPLY TO AFFECTED AREAS TWICE DAILY  . nystatin cream (MYCOSTATIN) APPLY TO AFFECTED AREAS TOPICALLY TWICE A DAY (Patient taking differently: APPLY TO AFFECTED AREAS TOPICALLY TWICE A DAY AS NEEDED)  . ONETOUCH VERIO test strip TEST BLOOD GLUCOSE TWICE DAILY  . pantoprazole (PROTONIX) 40 MG tablet Take 40 mg by mouth daily.  . phentermine 37.5 MG capsule Take by mouth.  . rosuvastatin (CRESTOR) 5 MG tablet Take 1 tablet (5 mg total) by mouth at bedtime.  . Syringe/Needle, Disp, (SYRINGE 3CC/25GX1") 25G X 1" 3 ML MISC 1 application by Does not apply route once a week.  . tiotropium (SPIRIVA HANDIHALER) 18 MCG inhalation capsule INHALE CONTENTS OF ONE CAPSULE ONCE A DAY AS DIRECTED  . triamcinolone  cream (KENALOG) 0.1 % Apply 1 application topically 2 (two) times daily.  . VENTOLIN HFA 108 (90 Base) MCG/ACT inhaler INHALE TWO PUFFS EVERY 6 HOURS AS NEEDEDWHEEZING OR SHORTNESS OF BREATH  . verapamil (CALAN-SR) 240 MG CR tablet Take 1 tablet (240 mg total) by mouth daily.   No facility-administered encounter medications on file as of 05/08/2018.     Review of Systems  Constitutional: Negative for appetite change and unexpected weight change.  HENT: Negative for congestion and sinus pressure.   Respiratory: Negative for cough, chest tightness and shortness of breath.     Cardiovascular: Negative for chest pain, palpitations and leg swelling.  Gastrointestinal: Negative for abdominal pain, diarrhea, nausea and vomiting.  Genitourinary: Negative for difficulty urinating and dysuria.  Musculoskeletal: Negative for joint swelling and myalgias.  Skin: Negative for color change and rash.  Neurological: Negative for dizziness, light-headedness and headaches.  Psychiatric/Behavioral: Negative for agitation and dysphoric mood.       Objective:    Physical Exam  Constitutional: She appears well-developed and well-nourished. No distress.  HENT:  Nose: Nose normal.  Mouth/Throat: Oropharynx is clear and moist.  Neck: Neck supple. No thyromegaly present.  Cardiovascular: Normal rate and regular rhythm.  Pulmonary/Chest: Breath sounds normal. No respiratory distress. She has no wheezes.  Abdominal: Soft. Bowel sounds are normal. There is no tenderness.  Musculoskeletal: She exhibits no edema or tenderness.  Lymphadenopathy:    She has no cervical adenopathy.  Skin: No rash noted. No erythema.  Psychiatric: She has a normal mood and affect. Her behavior is normal.    BP 126/78 (BP Location: Left Arm, Patient Position: Sitting, Cuff Size: Normal)   Pulse 60   Temp 98.9 F (37.2 C) (Oral)   Resp 18   Wt 263 lb 12.8 oz (119.7 kg)   SpO2 97%   BMI 49.04 kg/m  Wt Readings from Last 3  Encounters:  05/08/18 263 lb 12.8 oz (119.7 kg)  04/27/18 267 lb 6.4 oz (121.3 kg)  02/09/18 260 lb (117.9 kg)     Lab Results  Component Value Date   WBC 6.6 12/29/2017   HGB 11.3 (L) 12/29/2017   HCT 34.7 (L) 12/29/2017   PLT 283 12/29/2017   GLUCOSE 91 12/29/2017   CHOL 176 12/29/2017   TRIG 83 12/29/2017   HDL 61 12/29/2017   LDLCALC 98 12/29/2017   ALT 8 12/29/2017   AST 8 (L) 12/29/2017   NA 140 12/29/2017   K 3.8 12/29/2017   CL 100 12/29/2017   CREATININE 0.84 12/29/2017   BUN 23 12/29/2017   CO2 28 12/29/2017   TSH 2.55 10/16/2017   HGBA1C 6.0 (H) 12/29/2017   MICROALBUR <0.7 04/27/2018       Assessment & Plan:   Problem List Items Addressed This Visit    Anemia    Follow cbc.  S/p gastric surgery.        Relevant Orders   CBC with Differential/Platelet   Ferritin   Asthma    Breathing stable.  Doing well on current regimen.  Follow.        Atrial fibrillation and flutter (HCC)    In SR.  Followed by cardiology.        BMI 45.0-49.9, adult (HCC)    Discussed diet and exercise.  Follow.        Controlled type 2 diabetes mellitus without complication (HCC)    Desires to lose weight.  Low carb diet and exercise.  Discussed weight loss medication.  Discussed saxenda/victoza.  See about PA for medication.        Relevant Orders   Hemoglobin A1c   Basic metabolic panel   Depression    Mother recently passed away.  On citalopram.  Increased stress.  She does not feel needs anything more.  Follow.       Essential (primary) hypertension    Blood pressure under good control.  Continue same medication regimen.  Follow pressures.  Follow metabolic panel.        GERD (gastroesophageal reflux disease)    Controlled on current regimen.  Follow.         Hypercholesterolemia    On crestor.  Low cholesterol diet and exercise.  Follow lipid panel and liver function tests.        Relevant Orders   Hepatic function panel   Lipid panel    Other Visit  Diagnoses    Need for immunization against influenza       Relevant Orders   Flu Vaccine QUAD 36+ mos IM (Completed)       Charlene Scott, MD  

## 2018-05-09 DIAGNOSIS — J449 Chronic obstructive pulmonary disease, unspecified: Secondary | ICD-10-CM | POA: Diagnosis not present

## 2018-05-13 NOTE — Assessment & Plan Note (Signed)
On crestor.  Low cholesterol diet and exercise.  Follow lipid panel and liver function tests.   

## 2018-05-13 NOTE — Assessment & Plan Note (Signed)
Follow cbc.  S/p gastric surgery.

## 2018-05-13 NOTE — Assessment & Plan Note (Signed)
In SR.  Followed by cardiology.  

## 2018-05-13 NOTE — Assessment & Plan Note (Signed)
Controlled on current regimen.  Follow.  

## 2018-05-13 NOTE — Assessment & Plan Note (Signed)
Breathing stable.  Doing well on current regimen.  Follow.   

## 2018-05-13 NOTE — Assessment & Plan Note (Signed)
Mother recently passed away.  On citalopram.  Increased stress.  She does not feel needs anything more.  Follow.

## 2018-05-13 NOTE — Assessment & Plan Note (Signed)
Desires to lose weight.  Low carb diet and exercise.  Discussed weight loss medication.  Discussed saxenda/victoza.  See about PA for medication.

## 2018-05-13 NOTE — Assessment & Plan Note (Signed)
Discussed diet and exercise.  Follow.  

## 2018-05-13 NOTE — Assessment & Plan Note (Signed)
Blood pressure under good control.  Continue same medication regimen.  Follow pressures.  Follow metabolic panel.   

## 2018-05-17 ENCOUNTER — Telehealth: Payer: Self-pay

## 2018-05-17 NOTE — Telephone Encounter (Addendum)
Left message for patient to call back and schedule appt with pharmacist if agreeable.  ----- Message from Einar Pheasant, MD sent at 05/16/2018  4:26 AM EDT ----- Regarding: FW: saxenda/victoza coverage Please call and notify pt that I would like to schedule her an appt with our pharmacist to discussed starting [place] regarding coverage and to be shown how to use the medication.  If agreeable, schedule appt with Catie.  Thanks    Dr Nicki Reaper ----- Message ----- From: Nanci Pina, LPN Sent: 3/76/2831   8:45 AM EDT To: Einar Pheasant, MD Subject: FW: saxenda/victoza coverage                   Either with Katie or you can send script and then we can do the PA, and when she gets the medication we can have her come in for nurse visit and I can show her how either way. ----- Message ----- From: Einar Pheasant, MD Sent: 05/13/2018   4:49 PM EDT To: Nanci Pina, LPN Subject: saxenda/victoza coverage                       This pt has diabetes.  Wants to lose weight.  Sugar is controlled.  She had wanted to start phentermine.  I discussed with her regarding the possibility of saxenda/victoza.  As far as medication coverage, is the best thing to do to set her up with [facility]ie and let her evaluate and and instruct on how to give medication, etc?  Just let me know.    Thanks   Dr Nicki Reaper

## 2018-05-21 DIAGNOSIS — J22 Unspecified acute lower respiratory infection: Secondary | ICD-10-CM | POA: Diagnosis not present

## 2018-05-21 DIAGNOSIS — J029 Acute pharyngitis, unspecified: Secondary | ICD-10-CM | POA: Diagnosis not present

## 2018-05-21 DIAGNOSIS — K120 Recurrent oral aphthae: Secondary | ICD-10-CM | POA: Diagnosis not present

## 2018-06-07 ENCOUNTER — Other Ambulatory Visit: Payer: Self-pay | Admitting: Internal Medicine

## 2018-06-08 DIAGNOSIS — J449 Chronic obstructive pulmonary disease, unspecified: Secondary | ICD-10-CM | POA: Diagnosis not present

## 2018-06-29 ENCOUNTER — Other Ambulatory Visit: Payer: Self-pay | Admitting: Internal Medicine

## 2018-07-02 ENCOUNTER — Telehealth: Payer: Self-pay

## 2018-07-02 ENCOUNTER — Other Ambulatory Visit: Payer: Self-pay | Admitting: Internal Medicine

## 2018-07-02 ENCOUNTER — Other Ambulatory Visit (INDEPENDENT_AMBULATORY_CARE_PROVIDER_SITE_OTHER): Payer: PPO

## 2018-07-02 DIAGNOSIS — E7800 Pure hypercholesterolemia, unspecified: Secondary | ICD-10-CM

## 2018-07-02 DIAGNOSIS — E119 Type 2 diabetes mellitus without complications: Secondary | ICD-10-CM

## 2018-07-02 DIAGNOSIS — D649 Anemia, unspecified: Secondary | ICD-10-CM | POA: Diagnosis not present

## 2018-07-02 DIAGNOSIS — E876 Hypokalemia: Secondary | ICD-10-CM

## 2018-07-02 LAB — HEPATIC FUNCTION PANEL
ALBUMIN: 3.6 g/dL (ref 3.5–5.2)
ALT: 8 U/L (ref 0–35)
AST: 6 U/L (ref 0–37)
Alkaline Phosphatase: 54 U/L (ref 39–117)
BILIRUBIN DIRECT: 0.1 mg/dL (ref 0.0–0.3)
TOTAL PROTEIN: 6.3 g/dL (ref 6.0–8.3)
Total Bilirubin: 0.5 mg/dL (ref 0.2–1.2)

## 2018-07-02 LAB — CBC WITH DIFFERENTIAL/PLATELET
BASOS PCT: 0.7 % (ref 0.0–3.0)
Basophils Absolute: 0.1 10*3/uL (ref 0.0–0.1)
EOS ABS: 0.2 10*3/uL (ref 0.0–0.7)
Eosinophils Relative: 2.8 % (ref 0.0–5.0)
HEMATOCRIT: 35.2 % — AB (ref 36.0–46.0)
Hemoglobin: 11.7 g/dL — ABNORMAL LOW (ref 12.0–15.0)
Lymphocytes Relative: 24.4 % (ref 12.0–46.0)
Lymphs Abs: 1.7 10*3/uL (ref 0.7–4.0)
MCHC: 33.3 g/dL (ref 30.0–36.0)
MCV: 81.7 fl (ref 78.0–100.0)
MONOS PCT: 6.7 % (ref 3.0–12.0)
Monocytes Absolute: 0.5 10*3/uL (ref 0.1–1.0)
Neutro Abs: 4.7 10*3/uL (ref 1.4–7.7)
Neutrophils Relative %: 65.4 % (ref 43.0–77.0)
Platelets: 243 10*3/uL (ref 150.0–400.0)
RBC: 4.31 Mil/uL (ref 3.87–5.11)
RDW: 15 % (ref 11.5–15.5)
WBC: 7.1 10*3/uL (ref 4.0–10.5)

## 2018-07-02 LAB — BASIC METABOLIC PANEL
BUN: 14 mg/dL (ref 6–23)
CHLORIDE: 106 meq/L (ref 96–112)
CO2: 30 meq/L (ref 19–32)
Calcium: 8.7 mg/dL (ref 8.4–10.5)
Creatinine, Ser: 0.71 mg/dL (ref 0.40–1.20)
GFR: 87.82 mL/min (ref >60.00)
GLUCOSE: 89 mg/dL (ref 70–99)
POTASSIUM: 3.4 meq/L — AB (ref 3.5–5.1)
SODIUM: 142 meq/L (ref 135–145)

## 2018-07-02 LAB — HEMOGLOBIN A1C: Hgb A1c MFr Bld: 6 % (ref 4.6–6.5)

## 2018-07-02 LAB — LIPID PANEL
CHOL/HDL RATIO: 3
Cholesterol: 160 mg/dL (ref 0–200)
HDL: 56 mg/dL (ref >39.00)
LDL Cholesterol: 91 mg/dL (ref 0–99)
NonHDL: 104.06
TRIGLYCERIDES: 65 mg/dL (ref 0.0–149.0)
VLDL: 13 mg/dL (ref 0.0–40.0)

## 2018-07-02 LAB — FERRITIN: Ferritin: 14.7 ng/mL (ref 10.0–291.0)

## 2018-07-02 NOTE — Progress Notes (Signed)
Order placed for f/u potassium.  

## 2018-07-02 NOTE — Telephone Encounter (Signed)
Copied from [place] [phone]. Topic: Appointment Scheduling - Scheduling Inquiry for Clinic >> Jul 02, 2018  3:40 PM Bea Graff, NT wrote: Reason for CRM: Pt had to r/s her appt with Dr. Nicki Reaper for tomorrow due to a out of town family emergency. She would like to see if she can be worked in after 07/14/18? Please advise,.

## 2018-07-03 ENCOUNTER — Ambulatory Visit: Payer: Self-pay | Admitting: Internal Medicine

## 2018-07-03 NOTE — Telephone Encounter (Signed)
Pt scheduled  

## 2018-07-09 DIAGNOSIS — J449 Chronic obstructive pulmonary disease, unspecified: Secondary | ICD-10-CM | POA: Diagnosis not present

## 2018-07-18 ENCOUNTER — Encounter: Payer: Self-pay | Admitting: Internal Medicine

## 2018-07-18 ENCOUNTER — Ambulatory Visit (INDEPENDENT_AMBULATORY_CARE_PROVIDER_SITE_OTHER): Payer: PPO | Admitting: Internal Medicine

## 2018-07-18 VITALS — BP 128/78 | HR 68 | Temp 98.5°F | Resp 18 | Wt 264.8 lb

## 2018-07-18 DIAGNOSIS — I4891 Unspecified atrial fibrillation: Secondary | ICD-10-CM

## 2018-07-18 DIAGNOSIS — E876 Hypokalemia: Secondary | ICD-10-CM

## 2018-07-18 DIAGNOSIS — D649 Anemia, unspecified: Secondary | ICD-10-CM | POA: Diagnosis not present

## 2018-07-18 DIAGNOSIS — E7800 Pure hypercholesterolemia, unspecified: Secondary | ICD-10-CM | POA: Diagnosis not present

## 2018-07-18 DIAGNOSIS — K219 Gastro-esophageal reflux disease without esophagitis: Secondary | ICD-10-CM

## 2018-07-18 DIAGNOSIS — F329 Major depressive disorder, single episode, unspecified: Secondary | ICD-10-CM | POA: Diagnosis not present

## 2018-07-18 DIAGNOSIS — J4520 Mild intermittent asthma, uncomplicated: Secondary | ICD-10-CM | POA: Diagnosis not present

## 2018-07-18 DIAGNOSIS — Z23 Encounter for immunization: Secondary | ICD-10-CM

## 2018-07-18 DIAGNOSIS — E119 Type 2 diabetes mellitus without complications: Secondary | ICD-10-CM

## 2018-07-18 DIAGNOSIS — F32A Depression, unspecified: Secondary | ICD-10-CM

## 2018-07-18 DIAGNOSIS — I4892 Unspecified atrial flutter: Secondary | ICD-10-CM

## 2018-07-18 DIAGNOSIS — Z6841 Body Mass Index (BMI) 40.0 and over, adult: Secondary | ICD-10-CM

## 2018-07-18 DIAGNOSIS — I10 Essential (primary) hypertension: Secondary | ICD-10-CM

## 2018-07-18 MED ORDER — VERAPAMIL HCL ER 240 MG PO TBCR: 240.0000 mg | EXTENDED_RELEASE_TABLET | Freq: Every day | ORAL | 1 refills | Status: DC

## 2018-07-18 NOTE — Progress Notes (Signed)
Patient ID: Glenda Zavala, female   DOB: 08/02/1951, 67 y.o.   MRN: [id number]   Subjective:    Patient ID: Glenda Zavala, female    DOB: [birth year], 67 y.o.   MRN: [id number]  HPI  Patient here for a scheduled follow up.  She reports she is doing relatively well.  No chest pain.  No sob.  No acid reflux.  No abdominal pain.  Bowels moving.  Recently evaluated by GI for iron deficient anemia.  EGD and colonoscopy - unrevealing.  Recommended her stay on protonix for gastritis.  Recommended continuing iron pills and to stop donating blood.  Recommended stool cards.  Also discussed with her regarding capsule study.  No increased cough or congestion.  Increased stress.  Discussed with her today.  Overall she feels she is handling things relatively well.  Discussed her desire for weight loss.  Discussed treatment options.  See last note and recent phone note.  Discussed diet and exercise.     Past Medical History:  Diagnosis Date  . Allergic genetic state   . Anemia   . Arthritis   . Asthma   . COPD (chronic obstructive pulmonary disease) ([place])   . Depression   . Diabetes mellitus ([place])   . Environmental allergies   . GERD (gastroesophageal reflux disease)   . Heart murmur   . History of chicken pox   . Hypercholesterolemia   . Hyperlipidemia   . Hypertension   . Kidney stones   . PSVT (paroxysmal supraventricular tachycardia) ([place])    Past Surgical History:  Procedure Laterality Date  . ABDOMINAL HYSTERECTOMY  1990   secondary to bleeding and hyperplasia  . CESAREAN SECTION    . CHOLECYSTECTOMY N/A 03/30/2016   Procedure: LAPAROSCOPIC CHOLECYSTECTOMY;  Surgeon: Florene Glen, MD;  Location: [facility] ORS;  Service: General;  Laterality: N/A;  . COLONOSCOPY WITH PROPOFOL N/A 02/09/2018   Procedure: COLONOSCOPY WITH PROPOFOL;  Surgeon: Manya Silvas, MD;  Location: [facility] ENDOSCOPY;  Service: Endoscopy;  Laterality: N/A;  . ESOPHAGOGASTRODUODENOSCOPY (EGD) WITH PROPOFOL N/A 02/09/2018     Procedure: ESOPHAGOGASTRODUODENOSCOPY (EGD) WITH PROPOFOL;  Surgeon: Manya Silvas, MD;  Location: [facility] ENDOSCOPY;  Service: Endoscopy;  Laterality: N/A;  . LAPAROSCOPIC GASTRIC BANDING  2010  . TONSILLECTOMY    . TUBAL LIGATION    . WISDOM TOOTH EXTRACTION     Family History  Problem Relation Age of Onset  . Hypercholesterolemia Mother   . Hyperlipidemia Mother   . Aortic dissection Mother   . Emphysema Maternal Grandfather   . Uterine cancer Unknown        aunt  . Coronary artery disease Father   . Hypertension Father   . Hyperlipidemia Father   . Emphysema Maternal Grandmother   . Mental illness Daughter   . Breast cancer Neg Hx   . Colon cancer Neg Hx    Social History   Socioeconomic History  . Marital status: Married    Spouse name: Not on file  . Number of children: 2  . Years of education: Not on file  . Highest education level: Not on file  Occupational History  . Occupation: Pharmacist, hospital  Social Needs  . Financial resource strain: Not hard at all  . Food insecurity:    Worry: Never true    Inability: Never true  . Transportation needs:    Medical: No    Non-medical: No  Tobacco Use  . Smoking status: Never Smoker  . Smokeless tobacco: Never Used  Substance and Sexual Activity  . Alcohol use: No    Alcohol/week: 0.0 standard drinks  . Drug use: No  . Sexual activity: Not on file    Comment: Married   Lifestyle  . Physical activity:    Days per week: 3 days    Minutes per session: 60 min  . Stress: Not at all  Relationships  . Social connections:    Talks on phone: Not on file    Gets together: Not on file    Attends religious service: Not on file    Active member of club or organization: Not on file    Attends meetings of clubs or organizations: Not on file    Relationship status: Not on file  Other Topics Concern  . Not on file  Social History Narrative  . Not on file    Outpatient Encounter Medications as of 07/18/2018  Medication Sig   . albuterol (PROVENTIL) (2.5 MG/3ML) 0.083% nebulizer solution Take 3 mLs (2.5 mg total) by nebulization every 6 (six) hours as needed for shortness of breath. (Patient not taking: Reported on 04/27/2018)  . Blood Glucose Monitoring Suppl (ONETOUCH VERIO IQ SYSTEM) W/DEVICE KIT AS DIRECTED  . citalopram (CELEXA) 40 MG tablet TAKE ONE (1) TABLET BY MOUTH EVERY DAY  . cyanocobalamin (,VITAMIN B-12,) 1000 MCG/ML injection Inject 1043mg q month  . Fe Fum-FePoly-Vit C-Vit B3 (INTEGRA) 62.5-62.5-40-3 MG CAPS One capsule daily  . fexofenadine (ALLEGRA) 180 MG tablet Take 180 mg by mouth as needed for allergies or rhinitis.  . fluticasone (FLONASE) 50 MCG/ACT nasal spray USE 2 SPRAYS EACH NOSTRIL DAILY  . hydrochlorothiazide (HYDRODIURIL) 25 MG tablet TAKE 1 TABLET(25 MG) BY MOUTH DAILY  . Lancets (ACCU-CHEK SOFT TOUCH) lancets Use as instructed  . lisinopril (PRINIVIL,ZESTRIL) 40 MG tablet TAKE ONE TABLET BY MOUTH EVERY MORNING FOR BLOOD PRESSURE  . loratadine (CLARITIN) 10 MG tablet Take 10 mg by mouth daily.  . metFORMIN (GLUCOPHAGE) 1000 MG tablet Take 1 tablet (1,000 mg total) by mouth 2 (two) times daily.  . montelukast (SINGULAIR) 10 MG tablet Take 10 mg by mouth at bedtime as needed (allergies).   . Multiple Vitamin (MULTIVITAMIN WITH MINERALS) TABS tablet Take 1 tablet by mouth daily.  .Marland KitchenNYAMYC powder APPLY TO AFFECTED AREAS TWICE DAILY  . nystatin cream (MYCOSTATIN) APPLY TO AFFECTED AREAS TOPICALLY TWICE A DAY (Patient taking differently: APPLY TO AFFECTED AREAS TOPICALLY TWICE A DAY AS NEEDED)  . ONETOUCH VERIO test strip TEST BLOOD GLUCOSE TWICE DAILY  . pantoprazole (PROTONIX) 40 MG tablet Take 40 mg by mouth daily.  . phentermine 37.5 MG capsule Take by mouth.  . rosuvastatin (CRESTOR) 5 MG tablet TAKE 1 TABLET(5 MG) BY MOUTH AT BEDTIME  . Syringe/Needle, Disp, (SYRINGE 3CC/25GX1") 25G X 1" 3 ML MISC 1 application by Does not apply route once a week.  . tiotropium (SPIRIVA HANDIHALER) 18  MCG inhalation capsule INHALE CONTENTS OF ONE CAPSULE ONCE A DAY AS DIRECTED  . triamcinolone cream (KENALOG) 0.1 % Apply 1 application topically 2 (two) times daily.  . VENTOLIN HFA 108 (90 Base) MCG/ACT inhaler INHALE TWO PUFFS EVERY 6 HOURS AS NEEDEDWHEEZING OR SHORTNESS OF BREATH  . verapamil (CALAN-SR) 240 MG CR tablet Take 1 tablet (240 mg total) by mouth daily.  . [DISCONTINUED] verapamil (CALAN-SR) 240 MG CR tablet Take 1 tablet (240 mg total) by mouth daily.   No facility-administered encounter medications on file as of 07/18/2018.     Review of Systems  Constitutional: Negative  for appetite change and unexpected weight change.  HENT: Negative for congestion and sinus pressure.   Respiratory: Negative for cough, chest tightness and shortness of breath.   Cardiovascular: Negative for chest pain and palpitations.       No increased lower extremity swelling.   Gastrointestinal: Negative for abdominal pain, diarrhea, nausea and vomiting.  Genitourinary: Negative for difficulty urinating and dysuria.  Musculoskeletal: Negative for joint swelling and myalgias.  Skin: Negative for color change and rash.  Neurological: Negative for dizziness, light-headedness and headaches.  Psychiatric/Behavioral: Negative for agitation and dysphoric mood.       Increased stress as outlined.         Objective:    Physical Exam  Constitutional: She appears well-developed and well-nourished. No distress.  HENT:  Nose: Nose normal.  Mouth/Throat: Oropharynx is clear and moist.  Neck: Neck supple. No thyromegaly present.  Cardiovascular: Normal rate and regular rhythm.  Pulmonary/Chest: Breath sounds normal. No respiratory distress. She has no wheezes.  Abdominal: Soft. Bowel sounds are normal. There is no tenderness.  Musculoskeletal: She exhibits no tenderness.  No increased edema.    Lymphadenopathy:    She has no cervical adenopathy.  Skin: No rash noted. No erythema.  Psychiatric: She has  a normal mood and affect. Her behavior is normal.    BP 128/78 (BP Location: Left Arm, Patient Position: Sitting, Cuff Size: Large)   Pulse 68   Temp 98.5 F (36.9 C) (Oral)   Resp 18   Wt 264 lb 12.8 oz (120.1 kg)   SpO2 98%   BMI 49.22 kg/m  Wt Readings from Last 3 Encounters:  07/18/18 264 lb 12.8 oz (120.1 kg)  05/08/18 263 lb 12.8 oz (119.7 kg)  04/27/18 267 lb 6.4 oz (121.3 kg)     Lab Results  Component Value Date   WBC 7.1 07/02/2018   HGB 11.7 (L) 07/02/2018   HCT 35.2 (L) 07/02/2018   PLT 243.0 07/02/2018   GLUCOSE 89 07/02/2018   CHOL 160 07/02/2018   TRIG 65.0 07/02/2018   HDL 56.00 07/02/2018   LDLCALC 91 07/02/2018   ALT 8 07/02/2018   AST 6 07/02/2018   NA 142 07/02/2018   K 3.4 (L) 07/02/2018   CL 106 07/02/2018   CREATININE 0.71 07/02/2018   BUN 14 07/02/2018   CO2 30 07/02/2018   TSH 2.55 10/16/2017   HGBA1C 6.0 07/02/2018   MICROALBUR <0.7 04/27/2018       Assessment & Plan:   Problem List Items Addressed This Visit    Anemia    Is s/p gastric surgery.  Recently evaluated by GI.  EGD and colonoscopy unrevealing of a cause for the anemia.  Has hemoccult cards.  Follow cbc/ferritin.  Avoid giving blood for now.  Discussed capsule study.        Asthma    Breathing stable.  Doing well on current regimen.  Follow.        Atrial fibrillation and flutter (HCC)    In SR. Followed by cardiology.       Relevant Medications   verapamil (CALAN-SR) 240 MG CR tablet   BMI 45.0-49.9, adult (HCC)    Discussed diet and exercise.  Follow.        Controlled type 2 diabetes mellitus without complication (HCC)    Discussed diet and exercise. Low carb diet.  Discussed weight loss medication/treatment options.  Discussed saxenda/victoza.  Refer to Catie for discussion and cost effective treatment.  Pt in agreement.  Depression    On citalopram.  Coping with her mother's death.  Discussed with her today.  Does not feel she needs any further  intervention.  Follow.        Essential (primary) hypertension    Blood pressure under good control.  Continue same medication regimen.  Follow pressures.  Follow metabolic panel.        Relevant Medications   verapamil (CALAN-SR) 240 MG CR tablet   GERD (gastroesophageal reflux disease)    Controlled on current regimen.  Follow.       Hypercholesterolemia    On crestor.  Low cholesterol diet and exercise.  Follow lipid panel and liver function tests.        Relevant Medications   verapamil (CALAN-SR) 240 MG CR tablet    Other Visit Diagnoses    Hypokalemia    -  Primary       Einar Pheasant, MD

## 2018-07-21 ENCOUNTER — Encounter: Payer: Self-pay | Admitting: Internal Medicine

## 2018-07-21 NOTE — Assessment & Plan Note (Signed)
Controlled on current regimen.  Follow.  

## 2018-07-21 NOTE — Assessment & Plan Note (Signed)
Breathing stable.  Doing well on current regimen.  Follow.

## 2018-07-21 NOTE — Assessment & Plan Note (Signed)
Blood pressure under good control.  Continue same medication regimen.  Follow pressures.  Follow metabolic panel.   

## 2018-07-21 NOTE — Assessment & Plan Note (Signed)
Discussed diet and exercise. Low carb diet.  Discussed weight loss medication/treatment options.  Discussed saxenda/victoza.  Refer to Catie for discussion and cost effective treatment.  Pt in agreement.

## 2018-07-21 NOTE — Assessment & Plan Note (Signed)
In SR.  Followed by cardiology.  

## 2018-07-21 NOTE — Assessment & Plan Note (Signed)
Discussed diet and exercise.  Follow.  

## 2018-07-21 NOTE — Assessment & Plan Note (Signed)
On crestor.  Low cholesterol diet and exercise.  Follow lipid panel and liver function tests.   

## 2018-07-21 NOTE — Assessment & Plan Note (Signed)
Is s/p gastric surgery.  Recently evaluated by GI.  EGD and colonoscopy unrevealing of a cause for the anemia.  Has hemoccult cards.  Follow cbc/ferritin.  Avoid giving blood for now.  Discussed capsule study.

## 2018-07-21 NOTE — Assessment & Plan Note (Signed)
On citalopram.  Coping with her mother's death.  Discussed with her today.  Does not feel she needs any further intervention.  Follow.

## 2018-07-22 ENCOUNTER — Other Ambulatory Visit: Payer: Self-pay | Admitting: Internal Medicine

## 2018-07-23 ENCOUNTER — Encounter: Payer: Self-pay | Admitting: Internal Medicine

## 2018-07-23 ENCOUNTER — Ambulatory Visit (INDEPENDENT_AMBULATORY_CARE_PROVIDER_SITE_OTHER): Payer: PPO | Admitting: Pharmacist

## 2018-07-23 ENCOUNTER — Encounter: Payer: Self-pay | Admitting: Pharmacist

## 2018-07-23 VITALS — BP 131/78 | HR 69 | Wt 258.4 lb

## 2018-07-23 DIAGNOSIS — Z6841 Body Mass Index (BMI) 40.0 and over, adult: Secondary | ICD-10-CM

## 2018-07-23 DIAGNOSIS — E876 Hypokalemia: Secondary | ICD-10-CM

## 2018-07-23 DIAGNOSIS — E119 Type 2 diabetes mellitus without complications: Secondary | ICD-10-CM | POA: Diagnosis not present

## 2018-07-23 LAB — POTASSIUM: POTASSIUM: 3.7 meq/L (ref 3.5–5.1)

## 2018-07-23 MED ORDER — SEMAGLUTIDE(0.25 OR 0.5MG/DOS) 2 MG/1.5ML ~~LOC~~ SOPN: 0.5000 mg | PEN_INJECTOR | SUBCUTANEOUS | 2 refills | Status: DC

## 2018-07-23 NOTE — Patient Instructions (Addendum)
It was great to meet you today!   We are going to start Ozempic (semaglutide). Start with the smallest dose - 0.25 mg once weekly. Continue this for 4 weeks. If you are not having troublesome stomach upset, then increase to 0.5 mg once weekly.   If you start to have recurrent low blood sugars less than 70, give clinic a call and we can talk about decreasing the metformin.    Keep up the great work with the exercise and proper portions! Schedule follow up with me in 4-6 weeks.    Catie Darnelle Maffucci, PharmD

## 2018-07-23 NOTE — Assessment & Plan Note (Signed)
#  Diabetes/Obesity - Currently controlled, most recent A1c 6.0% on 07/02/2018. Goal A1c <7%. Appropriate to initiate GLP1 pharmacotherapy to aid with weight loss and provide positive benefit in concomitant diabetes - Start Ozempic (semaglutide) 0.25 mg once weekly x 4 weeks, then increase to 0.5 mg once weekly as tolerated - Continue metformin 1000 mg BID for now. Hypoglycemia is not expected, but if it develops moving forward, appropriate to decrease metformin dose to allow for maximization of GLP1 benefit

## 2018-07-23 NOTE — Progress Notes (Addendum)
S:     Chief Complaint  Patient presents with  . Medication Management    Diabetes/Weight loss    Patient arrives in good spirits, ambulating with a cane.  Presents for diabetes evaluation, education, and management at the request of Dr. Nicki Reaper at their last appointment on 07/18/2018. She was referred to pharmacy for medication management for weight loss and to start an GLP1 agent.  Denies personal history of thyroid cancer or pancreatitis. She notes that she has a lap band, which helps her with portion control. She engages in exercise by walking around her neighborhood typically 3 days per week. She denies ever having concerns with hypoglycemia.   She notes that she was started on 1 month supply of phentermine, but she did not see any weight loss while on this agent. She is also concerned about its potential to increase her BP/HR.   Patient reports Diabetes was diagnosed ~20 years ago  Insurance coverage/medication affordability:   Patient reports adherence with medications.  Current diabetes medications include: metformin 1000 mg BID Current hypertension medications include: HCTZ 25 mg daily, lisinopril 40 mg daily, verapamil 240 mg daily - Notes that she did not take her antihypertensives this morning.  Patient denies hypoglycemic s/sx including dizziness, shakiness, sweating. Patient denies hyperglycemic symptoms including polyuria, polydipsia, polyphagia, nocturia, neuropathy, blurred vision. Patient reports self foot exams.   Patient reported dietary habits: Eats 2 meals/day; hx lap band Breakfast: 2 oz tea, ham biscuit Lunch: Oftentimes skips this meal Dinner: Eats an early dinner (3:30-4 pm);  Snacks: Very infrequent, keeps rice cakes, peanut butter + 1 piece of bread occasionally  Drinks: Sweet tea a lot of the time; when making it at home, she uses splenda sweetener, and tries to choose restaurants that have less "syrupy" sweet tea  Patient reported exercise habits:  Walking outside ~ 30 minutes up to 3 times a week; does water walking when its warm outside; typically exercises 150 minutes during the summers; takes Tai Chi classes 1 day per week     O:  Physical Exam  Constitutional: She appears well-developed and well-nourished.     Review of Systems  All other systems reviewed and are negative.    Lab Results  Component Value Date   HGBA1C 6.0 07/02/2018   Vitals:   07/23/18 1341  BP: 131/78  Pulse: 69    Basic Metabolic Panel BMP Latest Ref Rng & Units 07/02/2018 12/29/2017 10/16/2017  Glucose 70 - 99 mg/dL 89 91 100(H)  BUN 6 - 23 mg/dL _0 Creatinine 0.40 - 1.20 mg/dL 0.71 0.84 0.83  BUN/Creat Ratio 6 - 22 (calc) - NOT APPLICABLE -  Sodium 903 - 145 mEq/L 142 140 140  Potassium 3.5 - 5.1 mEq/L 3.4(L) 3.8 3.6  Chloride 96 - 112 mEq/L 106 100 102  CO2 19 - 32 mEq/L _1 Calcium 8.4 - 10.5 mg/dL 8.7 9.1 9.2   Lipid Panel     Component Value Date/Time   CHOL 160 07/02/2018 0813   CHOL 171 06/13/2016 0820   TRIG 65.0 07/02/2018 0813   HDL 56.00 07/02/2018 0813   HDL 60 06/13/2016 0820   CHOLHDL 3 07/02/2018 0813   VLDL 13.0 07/02/2018 0813   LDLCALC 91 07/02/2018 0813   LDLCALC 98 12/29/2017 1547     Clinical ASCVD: No ; ASCVD risk factors: age 18-75 10 year ASCVD risk: 12.9%   A/P: Following discussion and approval by Dr. Nicki Reaper, the following medication changes  were made:   #Diabetes/Obesity - Currently controlled, most recent A1c 6.0% on 07/02/2018. Goal A1c <7%. Appropriate to initiate GLP1 pharmacotherapy to aid with weight loss and provide positive benefit in concomitant diabetes - Start Ozempic (semaglutide) 0.25 mg once weekly x 4 weeks, then increase to 0.5 mg once weekly as tolerated - Continue metformin 1000 mg BID for now. Hypoglycemia is not expected, but if it develops moving forward, appropriate to decrease metformin dose to allow for maximization of GLP1 benefit - Provided sample x1 pen of  Ozempic 0.25 or 0.5 mg pen.  #Hypertension currently controlled. Goal BP <140/90.  - Continue lisinopril 40 mg daily, HCTZ 25 mg daily, and verapamil 240 mg daily  #ASCVD risk - primary prevention in patient aged 32-75 with DM; ASCVD risk score <20%; moderate intensity statin indicated - Continue rosuvastatin 5 mg daily   Written patient instructions provided.  Total time in face to face counseling 60 minutes.    Follow up in Pharmacist Clinic Visit 4-6 weeks.   De Hollingshead, PharmD PGY2 Ambulatory Care Pharmacy Resident Phone: [phone]  Reviewed above information.  Agree with assessment and plan.    Dr Nicki Reaper

## 2018-08-07 ENCOUNTER — Ambulatory Visit
Admission: RE | Admit: 2018-08-07 | Discharge: 2018-08-07 | Disposition: A | Discharge status: Home / Self Care | Payer: PPO | Source: Ambulatory Visit | Attending: Internal Medicine | Admitting: Internal Medicine

## 2018-08-07 ENCOUNTER — Other Ambulatory Visit: Payer: Self-pay | Admitting: Internal Medicine

## 2018-08-07 DIAGNOSIS — Z1231 Encounter for screening mammogram for malignant neoplasm of breast: Secondary | ICD-10-CM | POA: Diagnosis not present

## 2018-08-07 DIAGNOSIS — Z1239 Encounter for other screening for malignant neoplasm of breast: Secondary | ICD-10-CM

## 2018-08-08 DIAGNOSIS — J449 Chronic obstructive pulmonary disease, unspecified: Secondary | ICD-10-CM | POA: Diagnosis not present

## 2018-08-20 ENCOUNTER — Encounter: Payer: Self-pay | Admitting: Pharmacist

## 2018-08-20 ENCOUNTER — Other Ambulatory Visit: Payer: Self-pay | Admitting: Internal Medicine

## 2018-08-20 ENCOUNTER — Ambulatory Visit (INDEPENDENT_AMBULATORY_CARE_PROVIDER_SITE_OTHER): Payer: PPO | Admitting: Pharmacist

## 2018-08-20 DIAGNOSIS — Z6841 Body Mass Index (BMI) 40.0 and over, adult: Secondary | ICD-10-CM | POA: Diagnosis not present

## 2018-08-20 DIAGNOSIS — E119 Type 2 diabetes mellitus without complications: Secondary | ICD-10-CM | POA: Diagnosis not present

## 2018-08-20 DIAGNOSIS — M25569 Pain in unspecified knee: Secondary | ICD-10-CM

## 2018-08-20 MED ORDER — SEMAGLUTIDE(0.25 OR 0.5MG/DOS) 2 MG/1.5ML ~~LOC~~ SOPN: 0.5000 mg | PEN_INJECTOR | SUBCUTANEOUS | 2 refills | Status: AC

## 2018-08-20 NOTE — Progress Notes (Signed)
Order placed for ortho referral.   

## 2018-08-20 NOTE — Assessment & Plan Note (Signed)
#  Diabetes/Obesity - Currently controlled, most recent A1c 6.0% on 07/02/2018. Goal A1c <7%. Patient is tolerating Ozempic; appropriate to titrate. - Increase Ozempic to 0.5 mg once weekly.  - Continue metformin 1000 mg BID.  - Encouraged patient to continue to increase exercise in combination with portion reduction - Counseled patient to contact clinic with any hypoglycemia or significant GI upset

## 2018-08-20 NOTE — Patient Instructions (Addendum)
It was great to see you today! Congratulations with your weight loss success thus far!   Let's increase Ozempic to 0.5 mg once weekly. Keep up the great work with exercises and diet.   Dr. Nicki Reaper is going to put in a referral for the orthopedic surgeon.    Schedule follow up with me in ~4-6 weeks. Feel free to call clinic with any questions or concerns in the meantime.   Catie Darnelle Maffucci, PharmD

## 2018-08-20 NOTE — Progress Notes (Addendum)
S:     Chief Complaint  Patient presents with  . Medication Management    Diabetes, Obesity    Patient arrives in good spirits, ambulating with a cane.  Presents for diabetes/obesity evaluation, education, and management at the request of Dr. Nicki Reaper at their last appointment on 07/18/2018. At last Pharmacy Visit on 07/23/2018, Ozempic was initiated.   Today, she notes that things are going well with Ozempic. Denies significant stomach upset and denies any hypoglycemia. She endorses the appetite suppressant effect from Ozempic, and notes that she has had an easier time avoiding carbohydrates (cakes, pies) during the holiday season. She notes that she has been taking the dog out to walk and play more, but has not been doing [place]. She plans to join the [facility] and go with a friend for accountability.   She notes that she previously began the process for knee surgery, but gained weight and was unable to have the procedure. She is interested in a referral today to determine next steps for qualifying for the procedure.  Patient reports Diabetes was diagnosed ~20 years ago  Insurance coverage/medication affordability: [employer]  Patient reports adherence with medications.  Current diabetes medications include: metformin 1000 mg BID, Ozempic 0.25 mg once weekly Current hypertension medications include: HCTZ 25 mg daily, lisinopril 40 mg daily, verapamil 240 mg daily  Patient denies hypoglycemic s/sx including dizziness, shakiness, sweating.  Patient reported dietary habits: Eats 2 meals/day; hx lap band; notes that she has been drinking a protein shake as a mid-morning and mid-afternoon snack  Patient reported exercise habits: Walking outside with her dog ~ 30 minutes daily;  - Plans on joining the [employer] and going with a friend   O:  Physical Exam Constitutional:      Appearance: She is well-developed.     Review of Systems  All other systems reviewed and are  negative.   Lab Results  Component Value Date   HGBA1C 6.0 07/02/2018   Vitals:   08/20/18 1025  BP: 123/70  Pulse: (!) 56    Basic Metabolic Panel BMP Latest Ref Rng & Units 07/23/2018 07/02/2018 12/29/2017  Glucose 70 - 99 mg/dL - 89 91  BUN 6 - 23 mg/dL - 14 23  Creatinine 0.40 - 1.20 mg/dL - 0.71 0.84  BUN/Creat Ratio 6 - 22 (calc) - - NOT APPLICABLE  Sodium 371 - 145 mEq/L - 142 140  Potassium 3.5 - 5.1 mEq/L 3.7 3.4(L) 3.8  Chloride 96 - 112 mEq/L - 106 100  CO2 19 - 32 mEq/L - 30 28  Calcium 8.4 - 10.5 mg/dL - 8.7 9.1   Lipid Panel     Component Value Date/Time   CHOL 160 07/02/2018 0813   CHOL 171 06/13/2016 0820   TRIG 65.0 07/02/2018 0813   HDL 56.00 07/02/2018 0813   HDL 60 06/13/2016 0820   CHOLHDL 3 07/02/2018 0813   VLDL 13.0 07/02/2018 0813   LDLCALC 91 07/02/2018 0813   LDLCALC 98 12/29/2017 1547     Clinical ASCVD: No ; ASCVD risk factors: age 34-75 10 year ASCVD risk: 12.9%   A/P: Following discussion and approval by Dr. Nicki Reaper, the following medication changes were made:   #Diabetes/Obesity - Currently controlled, most recent A1c 6.0% on 07/02/2018. Goal A1c <7%. Patient is tolerating Ozempic; appropriate to titrate. - Increase Ozempic to 0.5 mg once weekly.  - Continue metformin 1000 mg BID.  - Encouraged patient to continue to increase exercise in combination with portion  reduction - Counseled patient to contact clinic with any hypoglycemia or significant GI upset  Spoke with Dr. Nicki Reaper; she will place a referral for the patient to re-establish with Dr. Marry Guan  #Hypertension currently controlled. Goal BP <140/90.  - Continue lisinopril 40 mg daily, HCTZ 25 mg daily, and verapamil 240 mg daily  #ASCVD risk - primary prevention in patient aged 30-75 with DM; ASCVD risk score <20%; moderate intensity statin indicated - Continue rosuvastatin 5 mg daily  Written patient instructions provided.  Total time in face to face counseling 30  minutes.    Follow up in Pharmacist Clinic Visit 4-6 weeks.   De Hollingshead, PharmD PGY2 Ambulatory Care Pharmacy Resident   Reviewed above information.  Agree with assessment and plan.  Order placed for ortho referral.    Dr Nicki Reaper Phone: [phone]

## 2018-09-08 DIAGNOSIS — J449 Chronic obstructive pulmonary disease, unspecified: Secondary | ICD-10-CM | POA: Diagnosis not present

## 2018-09-10 ENCOUNTER — Other Ambulatory Visit: Payer: Self-pay | Admitting: Internal Medicine

## 2018-09-17 ENCOUNTER — Ambulatory Visit: Payer: Self-pay | Admitting: Pharmacist

## 2018-09-27 ENCOUNTER — Telehealth: Payer: Self-pay

## 2018-09-27 ENCOUNTER — Ambulatory Visit: Payer: Self-pay

## 2018-09-27 MED ORDER — LORAZEPAM 0.5 MG PO TABS: ORAL_TABLET | ORAL | 0 refills | Status: AC

## 2018-09-27 NOTE — Telephone Encounter (Signed)
Patient is at the funeral home right now getting ready to see her daughter. She does not want to come in. She is just wanting something to help her through the next couple days. The visitation is tomorrow and funeral is Saturday. She would like the rx sent to [facility] in [place] patient to call if she needs anything.

## 2018-09-27 NOTE — Telephone Encounter (Signed)
Pt calling to get prescription for anxiety. Pt's daughter was killed in a house fire Tuesday. Pt is going to see her daughter for the last time before cremation today. Pt very tearful and crying. Emotional support provided to pt. Pt given number to [facility]. Routing to office and calling office. Spoke with Judson Roch and sending note high priority.  Reason for Disposition . Patient sounds very upset or troubled to the triager  Answer Assessment - Initial Assessment Questions 1. CONCERN: "What happened that made you call today?"     Anxiety and grief - her daughter was killed in a house fire Tuesday 2. ANXIETY SYMPTOM SCREENING: "Can you describe how you have been feeling?"  (e.g., tense, restless, panicky, anxious, keyed up, trouble sleeping, trouble concentrating)      3. ONSET: "How long have you been feeling this way?"     Tuesday 4. RECURRENT: "Have you felt this way before?"  If yes: "What happened that time?" "What helped these feelings go away in the past?"       5. RISK OF HARM - SUICIDAL IDEATION:  "Do you ever have thoughts of hurting or killing yourself?"  (e.g., yes, no, no but preoccupation with thoughts about death)   - INTENT:  "Do you have thoughts of hurting or killing yourself right NOW?" (e.g., yes, no, N/A)   - PLAN: "Do you have a specific plan for how you would do this?" (e.g., gun, knife, overdose, no plan, N/A)     no 6. RISK OF HARM - HOMICIDAL IDEATION:  "Do you ever have thoughts of hurting or killing someone else?"  (e.g., yes, no, no but preoccupation with thoughts about death)   - INTENT:  "Do you have thoughts of hurting or killing someone right NOW?" (e.g., yes, no, N/A)   - PLAN: "Do you have a specific plan for how you would do this?" (e.g., gun, knife, no plan, N/A)      no 7. FUNCTIONAL IMPAIRMENT: "How have things been going for you overall in your life? Have you had any more difficulties than usual doing your normal daily activities?"  (e.g.,  better, same, worse; self-care, school, work, interactions)    Daughter was killed in house fire  8. SUPPORT: "Who is with you now?" "Who do you live with?" "Do you have family or friends nearby who you can talk to?"      Lives alone good support system 9. THERAPIST: "Do you have a counselor or therapist? Name?"     no 10. STRESSORS: "Has there been any new stress or recent changes in your life?"       Death of daughter 61. CAFFEINE ABUSE: "Do you drink caffeinated beverages, and how much each day?" (e.g., coffee, tea, colas)       Coffee tea 12. SUBSTANCE ABUSE: "Do you use any illegal drugs or alcohol?"       no 13. OTHER SYMPTOMS: "Do you have any other physical symptoms right now?" (e.g., chest pain, palpitations, difficulty breathing, fever)       Palpitations for 5 minutes then went away 14. PREGNANCY: "Is there any chance you are pregnant?" "When was your last menstrual period?"       n/a  Protocols used: ANXIETY AND PANIC ATTACK-A-AH

## 2018-09-27 NOTE — Telephone Encounter (Signed)
Copied from [place] [id number]. Topic: General - Inquiry >> Sep 27, 2018  9:11 AM Glenda Zavala wrote: Reason for CRM: Pt called upset and crying stating that her daughter passed away and she wanted to see if there was something that could help her get through the next few days of dealing with the death of her child. I had a nurse triage the pt for safety/please advise

## 2018-09-27 NOTE — Telephone Encounter (Signed)
I do not mind sending her in something to help her through this time, but does she feels she needs to come in?  I can see her at the end of the day today if feel needs to be seen.

## 2018-09-27 NOTE — Telephone Encounter (Signed)
See other message regarding work in appt.

## 2018-09-27 NOTE — Addendum Note (Signed)
Addended by: Alisa Graff on: 09/27/2018 05:19 PM   Modules accepted: Orders

## 2018-09-27 NOTE — Telephone Encounter (Signed)
Discussed with pt.  She feels she just needs to have something if needed.  Discussed options.  rx sent in for lorazepam .5mg  - take 1/2 tablet per day as needed.  Discussed possible side effects.  Will call if needs anything more.

## 2018-09-29 ENCOUNTER — Other Ambulatory Visit: Payer: Self-pay | Admitting: Internal Medicine

## 2018-10-03 ENCOUNTER — Other Ambulatory Visit: Payer: Self-pay | Admitting: Internal Medicine

## 2018-10-09 DIAGNOSIS — J449 Chronic obstructive pulmonary disease, unspecified: Secondary | ICD-10-CM | POA: Diagnosis not present

## 2018-11-01 ENCOUNTER — Telehealth: Payer: Self-pay | Admitting: Radiology

## 2018-11-01 ENCOUNTER — Other Ambulatory Visit: Payer: Self-pay | Admitting: Internal Medicine

## 2018-11-01 DIAGNOSIS — E7800 Pure hypercholesterolemia, unspecified: Secondary | ICD-10-CM

## 2018-11-01 DIAGNOSIS — D649 Anemia, unspecified: Secondary | ICD-10-CM

## 2018-11-01 DIAGNOSIS — E119 Type 2 diabetes mellitus without complications: Secondary | ICD-10-CM

## 2018-11-01 DIAGNOSIS — I10 Essential (primary) hypertension: Secondary | ICD-10-CM

## 2018-11-01 NOTE — Telephone Encounter (Signed)
Pt coming in for labs tomorrow, please place future orders. Thank you.  

## 2018-11-01 NOTE — Progress Notes (Signed)
Orders placed for f/u labs.  

## 2018-11-01 NOTE — Telephone Encounter (Signed)
Order placed for f/u labs.  

## 2018-11-02 ENCOUNTER — Other Ambulatory Visit: Payer: Self-pay

## 2018-11-02 ENCOUNTER — Other Ambulatory Visit (INDEPENDENT_AMBULATORY_CARE_PROVIDER_SITE_OTHER): Payer: PPO

## 2018-11-02 DIAGNOSIS — E7800 Pure hypercholesterolemia, unspecified: Secondary | ICD-10-CM

## 2018-11-02 DIAGNOSIS — E119 Type 2 diabetes mellitus without complications: Secondary | ICD-10-CM | POA: Diagnosis not present

## 2018-11-02 DIAGNOSIS — D649 Anemia, unspecified: Secondary | ICD-10-CM | POA: Diagnosis not present

## 2018-11-02 LAB — LIPID PANEL
Cholesterol: 160 mg/dL (ref 0–200)
HDL: 57.5 mg/dL (ref >39.00)
LDL Cholesterol: 88 mg/dL (ref 0–99)
NONHDL: 102.12
Total CHOL/HDL Ratio: 3
Triglycerides: 72 mg/dL (ref 0.0–149.0)
VLDL: 14.4 mg/dL (ref 0.0–40.0)

## 2018-11-02 LAB — BASIC METABOLIC PANEL
BUN: 20 mg/dL (ref 6–23)
CALCIUM: 9.2 mg/dL (ref 8.4–10.5)
CO2: 28 mEq/L (ref 19–32)
CREATININE: 0.79 mg/dL (ref 0.40–1.20)
Chloride: 103 mEq/L (ref 96–112)
GFR: 72.97 mL/min (ref >60.00)
GLUCOSE: 93 mg/dL (ref 70–99)
Potassium: 3.7 mEq/L (ref 3.5–5.1)
SODIUM: 141 meq/L (ref 135–145)

## 2018-11-02 LAB — CBC WITH DIFFERENTIAL/PLATELET
Basophils Absolute: 0 10*3/uL (ref 0.0–0.1)
Basophils Relative: 0.6 % (ref 0.0–3.0)
Eosinophils Absolute: 0.2 10*3/uL (ref 0.0–0.7)
Eosinophils Relative: 2.4 % (ref 0.0–5.0)
HCT: 38.7 % (ref 36.0–46.0)
Hemoglobin: 12.9 g/dL (ref 12.0–15.0)
Lymphocytes Relative: 23.3 % (ref 12.0–46.0)
Lymphs Abs: 1.9 10*3/uL (ref 0.7–4.0)
MCHC: 33.5 g/dL (ref 30.0–36.0)
MCV: 80.8 fl (ref 78.0–100.0)
MONO ABS: 0.6 10*3/uL (ref 0.1–1.0)
Monocytes Relative: 8 % (ref 3.0–12.0)
Neutro Abs: 5.2 10*3/uL (ref 1.4–7.7)
Neutrophils Relative %: 65.7 % (ref 43.0–77.0)
Platelets: 263 10*3/uL (ref 150.0–400.0)
RBC: 4.78 Mil/uL (ref 3.87–5.11)
RDW: 14.4 % (ref 11.5–15.5)
WBC: 8 10*3/uL (ref 4.0–10.5)

## 2018-11-02 LAB — HEPATIC FUNCTION PANEL
ALT: 9 U/L (ref 0–35)
AST: 6 U/L (ref 0–37)
Albumin: 4.1 g/dL (ref 3.5–5.2)
Alkaline Phosphatase: 71 U/L (ref 39–117)
Bilirubin, Direct: 0.1 mg/dL (ref 0.0–0.3)
TOTAL PROTEIN: 6.5 g/dL (ref 6.0–8.3)
Total Bilirubin: 0.5 mg/dL (ref 0.2–1.2)

## 2018-11-02 LAB — VITAMIN B12: Vitamin B-12: 359 pg/mL (ref 211–911)

## 2018-11-02 LAB — FERRITIN: Ferritin: 24.2 ng/mL (ref 10.0–291.0)

## 2018-11-02 LAB — HEMOGLOBIN A1C: Hgb A1c MFr Bld: 6 % (ref 4.6–6.5)

## 2018-11-05 ENCOUNTER — Encounter: Payer: Self-pay | Admitting: Internal Medicine

## 2018-11-06 LAB — HM DIABETES EYE EXAM

## 2018-11-07 DIAGNOSIS — J449 Chronic obstructive pulmonary disease, unspecified: Secondary | ICD-10-CM | POA: Diagnosis not present

## 2018-11-13 DIAGNOSIS — M1711 Unilateral primary osteoarthritis, right knee: Secondary | ICD-10-CM | POA: Diagnosis not present

## 2018-11-13 DIAGNOSIS — M25561 Pain in right knee: Secondary | ICD-10-CM | POA: Diagnosis not present

## 2018-11-16 ENCOUNTER — Encounter: Payer: Self-pay | Admitting: Internal Medicine

## 2018-11-16 ENCOUNTER — Ambulatory Visit (INDEPENDENT_AMBULATORY_CARE_PROVIDER_SITE_OTHER): Payer: PPO | Admitting: Internal Medicine

## 2018-11-16 ENCOUNTER — Other Ambulatory Visit: Payer: Self-pay

## 2018-11-16 VITALS — BP 110/60 | HR 62 | Temp 98.5°F | Wt 246.2 lb

## 2018-11-16 DIAGNOSIS — D649 Anemia, unspecified: Secondary | ICD-10-CM | POA: Diagnosis not present

## 2018-11-16 DIAGNOSIS — Z6841 Body Mass Index (BMI) 40.0 and over, adult: Secondary | ICD-10-CM | POA: Diagnosis not present

## 2018-11-16 DIAGNOSIS — K219 Gastro-esophageal reflux disease without esophagitis: Secondary | ICD-10-CM

## 2018-11-16 DIAGNOSIS — I4892 Unspecified atrial flutter: Secondary | ICD-10-CM | POA: Diagnosis not present

## 2018-11-16 DIAGNOSIS — E7800 Pure hypercholesterolemia, unspecified: Secondary | ICD-10-CM | POA: Diagnosis not present

## 2018-11-16 DIAGNOSIS — E119 Type 2 diabetes mellitus without complications: Secondary | ICD-10-CM

## 2018-11-16 DIAGNOSIS — I10 Essential (primary) hypertension: Secondary | ICD-10-CM

## 2018-11-16 DIAGNOSIS — F320 Major depressive disorder, single episode, mild: Secondary | ICD-10-CM

## 2018-11-16 DIAGNOSIS — F32A Depression, unspecified: Secondary | ICD-10-CM

## 2018-11-16 DIAGNOSIS — M25569 Pain in unspecified knee: Secondary | ICD-10-CM | POA: Diagnosis not present

## 2018-11-16 DIAGNOSIS — G8929 Other chronic pain: Secondary | ICD-10-CM

## 2018-11-16 DIAGNOSIS — Z0000 Encounter for general adult medical examination without abnormal findings: Secondary | ICD-10-CM

## 2018-11-16 DIAGNOSIS — J4520 Mild intermittent asthma, uncomplicated: Secondary | ICD-10-CM

## 2018-11-16 DIAGNOSIS — I4891 Unspecified atrial fibrillation: Secondary | ICD-10-CM

## 2018-11-16 MED ORDER — CYANOCOBALAMIN 1000 MCG/ML IJ SOLN: INTRAMUSCULAR | 0 refills | Status: AC

## 2018-11-16 MED ORDER — VERAPAMIL HCL ER 240 MG PO TBCR: 240.0000 mg | EXTENDED_RELEASE_TABLET | Freq: Every day | ORAL | 1 refills | Status: AC

## 2018-11-16 MED ORDER — HYDROCHLOROTHIAZIDE 25 MG PO TABS: ORAL_TABLET | ORAL | 0 refills | Status: AC

## 2018-11-16 MED ORDER — METFORMIN HCL 1000 MG PO TABS: 1000.0000 mg | ORAL_TABLET | Freq: Two times a day (BID) | ORAL | 1 refills | Status: AC

## 2018-11-16 MED ORDER — CITALOPRAM HYDROBROMIDE 40 MG PO TABS: ORAL_TABLET | ORAL | 1 refills | Status: AC

## 2018-11-16 MED ORDER — INTEGRA 62.5-62.5-40-3 MG PO CAPS: 1.0000 | ORAL_CAPSULE | Freq: Every day | ORAL | 0 refills | Status: AC

## 2018-11-16 NOTE — Progress Notes (Signed)
Patient ID: Glenda Zavala, female   DOB: November 08, 1950, 68 y.o.   MRN: [id number]   Subjective:    Patient ID: Glenda Zavala, female    DOB: 09/23/1950, 68 y.o.   MRN: [id number]  HPI  Patient here for her physical exam.  Was seen recently for right knee pain - by ortho.  S/p injection.  Ultimate plan will be for surgery.  She is trying to lose weight so that she can have her surgery.  No chest pain.  No sob.  No acid reflux.  No abdominal pain.  Bowels moving.  No urine change.  Has adjusted her diet.  Trying to stay as active as possible.  Has lost weight.  Still with increased stress.  Trying to cope with her daughter's unexpected death.  Discussed with her today.  Has good support.  Does not feel she needs anything more at this time.     Past Medical History:  Diagnosis Date  . Allergic genetic state   . Anemia   . Arthritis   . Asthma   . COPD (chronic obstructive pulmonary disease) ([place])   . Depression   . Diabetes mellitus ([place])   . Environmental allergies   . GERD (gastroesophageal reflux disease)   . Heart murmur   . History of chicken pox   . Hypercholesterolemia   . Hyperlipidemia   . Hypertension   . Kidney stones   . PSVT (paroxysmal supraventricular tachycardia) ([place])    Past Surgical History:  Procedure Laterality Date  . ABDOMINAL HYSTERECTOMY  1990   secondary to bleeding and hyperplasia  . CESAREAN SECTION    . CHOLECYSTECTOMY N/A 03/30/2016   Procedure: LAPAROSCOPIC CHOLECYSTECTOMY;  Surgeon: Florene Glen, MD;  Location: [facility] ORS;  Service: General;  Laterality: N/A;  . COLONOSCOPY WITH PROPOFOL N/A 02/09/2018   Procedure: COLONOSCOPY WITH PROPOFOL;  Surgeon: Manya Silvas, MD;  Location: [facility] ENDOSCOPY;  Service: Endoscopy;  Laterality: N/A;  . ESOPHAGOGASTRODUODENOSCOPY (EGD) WITH PROPOFOL N/A 02/09/2018   Procedure: ESOPHAGOGASTRODUODENOSCOPY (EGD) WITH PROPOFOL;  Surgeon: Manya Silvas, MD;  Location: [facility] ENDOSCOPY;  Service: Endoscopy;   Laterality: N/A;  . LAPAROSCOPIC GASTRIC BANDING  2010  . TONSILLECTOMY    . TUBAL LIGATION    . WISDOM TOOTH EXTRACTION     Family History  Problem Relation Age of Onset  . Hypercholesterolemia Mother   . Hyperlipidemia Mother   . Aortic dissection Mother   . Emphysema Maternal Grandfather   . Uterine cancer Other        aunt  . Coronary artery disease Father   . Hypertension Father   . Hyperlipidemia Father   . Emphysema Maternal Grandmother   . Mental illness Daughter   . Breast cancer Neg Hx   . Colon cancer Neg Hx    Social History   Socioeconomic History  . Marital status: Married    Spouse name: Not on file  . Number of children: 2  . Years of education: Not on file  . Highest education level: Not on file  Occupational History  . Occupation: Pharmacist, hospital  Social Needs  . Financial resource strain: Not hard at all  . Food insecurity:    Worry: Never true    Inability: Never true  . Transportation needs:    Medical: No    Non-medical: No  Tobacco Use  . Smoking status: Never Smoker  . Smokeless tobacco: Never Used  Substance and Sexual Activity  . Alcohol use: No  Alcohol/week: 0.0 standard drinks  . Drug use: No  . Sexual activity: Not on file    Comment: Married   Lifestyle  . Physical activity:    Days per week: 3 days    Minutes per session: 60 min  . Stress: Not at all  Relationships  . Social connections:    Talks on phone: Not on file    Gets together: Not on file    Attends religious service: Not on file    Active member of club or organization: Not on file    Attends meetings of clubs or organizations: Not on file    Relationship status: Not on file  Other Topics Concern  . Not on file  Social History Narrative  . Not on file    Outpatient Encounter Medications as of 11/16/2018  Medication Sig  . albuterol (PROVENTIL) (2.5 MG/3ML) 0.083% nebulizer solution Take 3 mLs (2.5 mg total) by nebulization every 6 (six) hours as needed for  shortness of breath.  . Blood Glucose Monitoring Suppl (ONETOUCH VERIO IQ SYSTEM) W/DEVICE KIT AS DIRECTED  . citalopram (CELEXA) 40 MG tablet TAKE ONE (1) TABLET BY MOUTH EVERY DAY  . cyanocobalamin (,VITAMIN B-12,) 1000 MCG/ML injection Inject 1061mg q month  . Fe Fum-FePoly-Vit C-Vit B3 (INTEGRA) 62.5-62.5-40-3 MG CAPS Take 1 capsule by mouth daily.  . fexofenadine (ALLEGRA) 180 MG tablet Take 180 mg by mouth as needed for allergies or rhinitis.  . fluticasone (FLONASE) 50 MCG/ACT nasal spray USE 2 SPRAYS EACH NOSTRIL DAILY  . hydrochlorothiazide (HYDRODIURIL) 25 MG tablet TAKE 1 TABLET(25 MG) BY MOUTH DAILY  . Lancets (ACCU-CHEK SOFT TOUCH) lancets Use as instructed  . lisinopril (PRINIVIL,ZESTRIL) 40 MG tablet TAKE 1 TABLET BY MOUTH EVERY MORNING FOR BLOOD PRESSURE  . loratadine (CLARITIN) 10 MG tablet Take 10 mg by mouth daily.  .Marland KitchenLORazepam (ATIVAN) 0.5 MG tablet Take 1/2 table q day prn  . metFORMIN (GLUCOPHAGE) 1000 MG tablet Take 1 tablet (1,000 mg total) by mouth 2 (two) times daily.  . montelukast (SINGULAIR) 10 MG tablet Take 10 mg by mouth at bedtime as needed (allergies).   . Multiple Vitamin (MULTIVITAMIN WITH MINERALS) TABS tablet Take 1 tablet by mouth daily.  .Marland Kitchennystatin cream (MYCOSTATIN) APPLY TO AFFECTED AREAS TOPICALLY TWICE A DAY (Patient taking differently: APPLY TO AFFECTED AREAS TOPICALLY TWICE A DAY AS NEEDED)  . ONETOUCH VERIO test strip TEST BLOOD GLUCOSE TWICE DAILY  . pantoprazole (PROTONIX) 40 MG tablet Take 40 mg by mouth daily.  . rosuvastatin (CRESTOR) 5 MG tablet TAKE 1 TABLET(5 MG) BY MOUTH AT BEDTIME  . Semaglutide,0.25 or 0.5MG/DOS, (OZEMPIC, 0.25 OR 0.5 MG/DOSE,) 2 MG/1.5ML SOPN Inject 0.5 mg into the skin once a week.  . Syringe/Needle, Disp, (SYRINGE 3CC/25GX1") 25G X 1" 3 ML MISC 1 application by Does not apply route once a week.  . tiotropium (SPIRIVA HANDIHALER) 18 MCG inhalation capsule INHALE CONTENTS OF ONE CAPSULE ONCE A DAY AS DIRECTED  .  triamcinolone cream (KENALOG) 0.1 % Apply 1 application topically 2 (two) times daily.  . VENTOLIN HFA 108 (90 Base) MCG/ACT inhaler INHALE TWO PUFFS EVERY 6 HOURS AS NEEDEDWHEEZING OR SHORTNESS OF BREATH  . verapamil (CALAN-SR) 240 MG CR tablet Take 1 tablet (240 mg total) by mouth daily.  . [DISCONTINUED] citalopram (CELEXA) 40 MG tablet TAKE ONE (1) TABLET BY MOUTH EVERY DAY  . [DISCONTINUED] cyanocobalamin (,VITAMIN B-12,) 1000 MCG/ML injection Inject 10068m q month  . [DISCONTINUED] Fe Fum-FePoly-Vit C-Vit B3 (INTEGRA) 62.5-62.5-40-3  MG CAPS TAKE 1 CAPSULE BY MOUTH DAILY  . [DISCONTINUED] hydrochlorothiazide (HYDRODIURIL) 25 MG tablet TAKE 1 TABLET(25 MG) BY MOUTH DAILY  . [DISCONTINUED] metFORMIN (GLUCOPHAGE) 1000 MG tablet Take 1 tablet (1,000 mg total) by mouth 2 (two) times daily.  . [DISCONTINUED] verapamil (CALAN-SR) 240 MG CR tablet Take 1 tablet (240 mg total) by mouth daily.   No facility-administered encounter medications on file as of 11/16/2018.     Review of Systems  Constitutional: Negative for appetite change and unexpected weight change.  HENT: Negative for congestion and sinus pressure.   Eyes: Negative for pain and visual disturbance.  Respiratory: Negative for cough, chest tightness and shortness of breath.   Cardiovascular: Negative for chest pain, palpitations and leg swelling.  Gastrointestinal: Negative for abdominal pain, diarrhea, nausea and vomiting.  Genitourinary: Negative for difficulty urinating and dysuria.  Musculoskeletal: Negative for joint swelling and myalgias.  Skin: Negative for color change and rash.  Neurological: Negative for dizziness, light-headedness and headaches.  Hematological: Negative for adenopathy. Does not bruise/bleed easily.  Psychiatric/Behavioral: Negative for agitation and dysphoric mood.       Objective:    Physical Exam Constitutional:      General: She is not in acute distress.    Appearance: Normal appearance. She is  well-developed.  HENT:     Nose: Nose normal. No congestion.     Mouth/Throat:     Pharynx: No oropharyngeal exudate or posterior oropharyngeal erythema.  Eyes:     General: No scleral icterus.       Right eye: No discharge.        Left eye: No discharge.  Neck:     Musculoskeletal: Neck supple. No muscular tenderness.     Thyroid: No thyromegaly.  Cardiovascular:     Rate and Rhythm: Normal rate and regular rhythm.  Pulmonary:     Effort: No tachypnea, accessory muscle usage or respiratory distress.     Breath sounds: Normal breath sounds. No decreased breath sounds or wheezing.  Chest:     Breasts:        Right: No inverted nipple, mass, nipple discharge or tenderness (no axillary adenopathy).        Left: No inverted nipple, mass, nipple discharge or tenderness (no axilarry adenopathy).  Abdominal:     General: Bowel sounds are normal.     Palpations: Abdomen is soft.     Tenderness: There is no abdominal tenderness.  Musculoskeletal:        General: No swelling or tenderness.  Lymphadenopathy:     Cervical: No cervical adenopathy.  Skin:    Findings: No erythema or rash.  Neurological:     Mental Status: She is alert and oriented to person, place, and time.  Psychiatric:        Mood and Affect: Mood normal.        Behavior: Behavior normal.     BP 110/60   Pulse 62   Temp 98.5 F (36.9 C) (Oral)   Wt 246 lb 3.2 oz (111.7 kg)   SpO2 98%   BMI 45.77 kg/m  Wt Readings from Last 3 Encounters:  11/16/18 246 lb 3.2 oz (111.7 kg)  08/20/18 252 lb 12.8 oz (114.7 kg)  07/23/18 258 lb 6.4 oz (117.2 kg)     Lab Results  Component Value Date   WBC 8.0 11/02/2018   HGB 12.9 11/02/2018   HCT 38.7 11/02/2018   PLT 263.0 11/02/2018   GLUCOSE 93 11/02/2018   CHOL 160  11/02/2018   TRIG 72.0 11/02/2018   HDL 57.50 11/02/2018   LDLCALC 88 11/02/2018   ALT 9 11/02/2018   AST 6 11/02/2018   NA 141 11/02/2018   K 3.7 11/02/2018   CL 103 11/02/2018   CREATININE 0.79  11/02/2018   BUN 20 11/02/2018   CO2 28 11/02/2018   TSH 2.55 10/16/2017   HGBA1C 6.0 11/02/2018   MICROALBUR <0.7 04/27/2018    Mm 3d Screen Breast Bilateral  Result Date: 08/07/2018 CLINICAL DATA:  Screening. EXAM: DIGITAL SCREENING BILATERAL MAMMOGRAM WITH TOMO AND CAD COMPARISON:  Previous exam(s). ACR Breast Density Category b: There are scattered areas of fibroglandular density. FINDINGS: There are no findings suspicious for malignancy. Images were processed with CAD. IMPRESSION: No mammographic evidence of malignancy. A result letter of this screening mammogram will be mailed directly to the patient. RECOMMENDATION: Screening mammogram in one year. (Code:SM-B-01Y) BI-RADS CATEGORY  1: Negative. Electronically Signed   By: Ammie Ferrier M.D.   On: 08/07/2018 13:49       Assessment & Plan:   Problem List Items Addressed This Visit    Anemia    Is s/p gastric surgery.  Has been valuated by GI.  EGD and colonoscopy unrevealing of a cause for the anemia.  Follow cbc.        Relevant Medications   cyanocobalamin (,VITAMIN B-12,) 1000 MCG/ML injection   Fe Fum-FePoly-Vit C-Vit B3 (INTEGRA) 62.5-62.5-40-3 MG CAPS   Other Relevant Orders   CBC with Differential/Platelet   Ferritin   Asthma    Breathing stable.  Doing well on current regimen.        Atrial fibrillation and flutter (HCC)    In SR.  Followed by cardiology.        Relevant Medications   hydrochlorothiazide (HYDRODIURIL) 25 MG tablet   verapamil (CALAN-SR) 240 MG CR tablet   BMI 45.0-49.9, adult ([place])    She has adjusted her diet.  Has lost weight.  Continuing to work on diet and exercise.  Follow.        Relevant Medications   metFORMIN (GLUCOPHAGE) 1000 MG tablet   Controlled type 2 diabetes mellitus without complication (HCC)    Sugars doing well.  On ozempic.  Has adjusted her diet. Lost weight.  Follow met b and a1c.   Lab Results  Component Value Date   HGBA1C 6.0 11/02/2018        Relevant  Medications   metFORMIN (GLUCOPHAGE) 1000 MG tablet   Other Relevant Orders   Hemoglobin A1c   Essential (primary) hypertension    Blood pressure under good control.  Continue same medication regimen.  Follow pressures.  Follow metabolic panel.        Relevant Medications   hydrochlorothiazide (HYDRODIURIL) 25 MG tablet   verapamil (CALAN-SR) 240 MG CR tablet   Other Relevant Orders   TSH   Basic metabolic panel   GERD (gastroesophageal reflux disease)    Controlled on current regimen.        Health care maintenance    Physical today 11/16/18.  Colonoscopy 12/2012.  Recommended f/u in 5-10 years.  Mammogram 08/07/18 - birads I.        Hypercholesterolemia    On crestor.  Low cholesterol diet and exercise.  Follow lipid panel and liver function tests.        Relevant Medications   hydrochlorothiazide (HYDRODIURIL) 25 MG tablet   verapamil (CALAN-SR) 240 MG CR tablet   Other Relevant Orders   Hepatic function  panel   Lipid panel   Knee pain    S/p injection.  Helped.  Planning for surgery down the road.  Follow.  Seeing ortho.        Mild depression ([place])    Overall handling things relatively well.   Has good support.  Does not feel needs anything more at this time.  Follow.        Relevant Medications   citalopram (CELEXA) 40 MG tablet    Other Visit Diagnoses    Routine general medical examination at a health care facility    -  Primary       Einar Pheasant, MD

## 2018-11-18 ENCOUNTER — Encounter: Payer: Self-pay | Admitting: Internal Medicine

## 2018-11-18 NOTE — Assessment & Plan Note (Signed)
In SR.  Followed by cardiology.  

## 2018-11-18 NOTE — Assessment & Plan Note (Signed)
She has adjusted her diet.  Has lost weight.  Continuing to work on diet and exercise.  Follow.

## 2018-11-18 NOTE — Assessment & Plan Note (Signed)
Physical today 11/16/18.  Colonoscopy 12/2012.  Recommended f/u in 5-10 years.  Mammogram 08/07/18 - birads I.

## 2018-11-18 NOTE — Assessment & Plan Note (Signed)
Sugars doing well.  On ozempic.  Has adjusted her diet. Lost weight.  Follow met b and a1c.   Lab Results  Component Value Date   HGBA1C 6.0 11/02/2018

## 2018-11-18 NOTE — Assessment & Plan Note (Signed)
Breathing stable.  Doing well on current regimen.   

## 2018-11-18 NOTE — Assessment & Plan Note (Signed)
Overall handling things relatively well.   Has good support.  Does not feel needs anything more at this time.  Follow.

## 2018-11-18 NOTE — Assessment & Plan Note (Signed)
Controlled on current regimen.   

## 2018-11-18 NOTE — Assessment & Plan Note (Signed)
Blood pressure under good control.  Continue same medication regimen.  Follow pressures.  Follow metabolic panel.   

## 2018-11-18 NOTE — Assessment & Plan Note (Signed)
Is s/p gastric surgery.  Has been valuated by GI.  EGD and colonoscopy unrevealing of a cause for the anemia.  Follow cbc.

## 2018-11-18 NOTE — Assessment & Plan Note (Signed)
On crestor.  Low cholesterol diet and exercise.  Follow lipid panel and liver function tests.   

## 2018-11-18 NOTE — Assessment & Plan Note (Signed)
S/p injection.  Helped.  Planning for surgery down the road.  Follow.  Seeing ortho.

## 2018-12-12 ENCOUNTER — Telehealth: Payer: Self-pay | Admitting: Internal Medicine

## 2018-12-12 NOTE — Telephone Encounter (Signed)
Copied from [place] [phone]. Topic: Quick Communication - See Telephone Encounter >> Dec 12, 2018  1:22 PM Vernona Rieger wrote: CRM for notification. See Telephone encounter for: 12/12/18.  Patient's daughter in law , [place] called and said that she was killed over the weekend along with her husband in the shooting that happened in [place]. She had me cancel all appts.

## 2018-12-17 ENCOUNTER — Telehealth: Payer: Self-pay

## 2018-12-17 NOTE — Telephone Encounter (Signed)
Copied from [place] [phone]. Topic: General - Other >> Dec 14, 2018  4:54 PM Nils Flack wrote: Reason for CRM: [employer] is returning call to [place]. Please call back  [phone]

## 2018-12-21 NOTE — Telephone Encounter (Signed)
Spoke with [employer] to let them know that pt is deceased

## 2018-12-21 DEATH — deceased

## 2019-03-25 ENCOUNTER — Other Ambulatory Visit: Payer: Self-pay

## 2019-03-28 ENCOUNTER — Ambulatory Visit: Payer: Self-pay | Admitting: Internal Medicine

## 2019-05-03 ENCOUNTER — Ambulatory Visit: Payer: Self-pay | Admitting: Internal Medicine

## 2019-05-03 ENCOUNTER — Ambulatory Visit: Payer: Self-pay

## 2020-08-19 IMAGING — MG DIGITAL SCREENING BILATERAL MAMMOGRAM WITH TOMO AND CAD
6 of 10 series · 6 of 30 positions shown · non-contrast
Comparison: Previous exam(s).

CLINICAL DATA: Screening.

EXAM:
DIGITAL SCREENING BILATERAL MAMMOGRAM WITH TOMO AND CAD

[R CC synth-2D (1 of 2)]
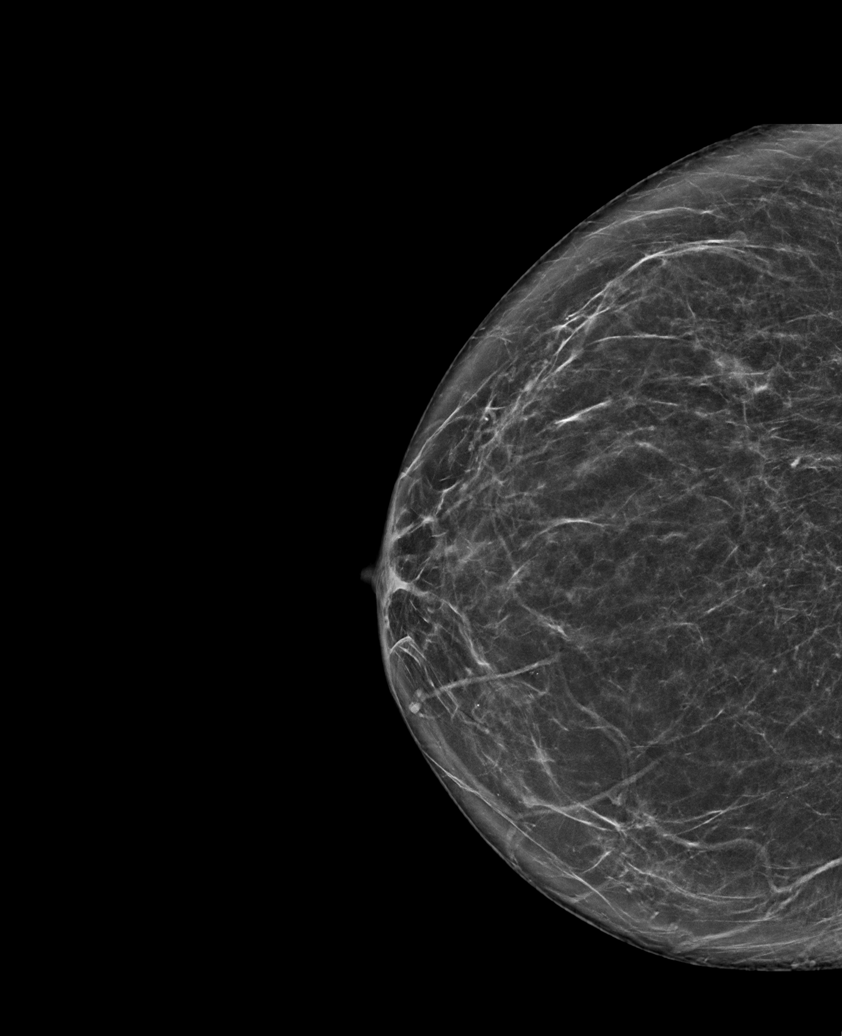

[R MLO synth-2D]
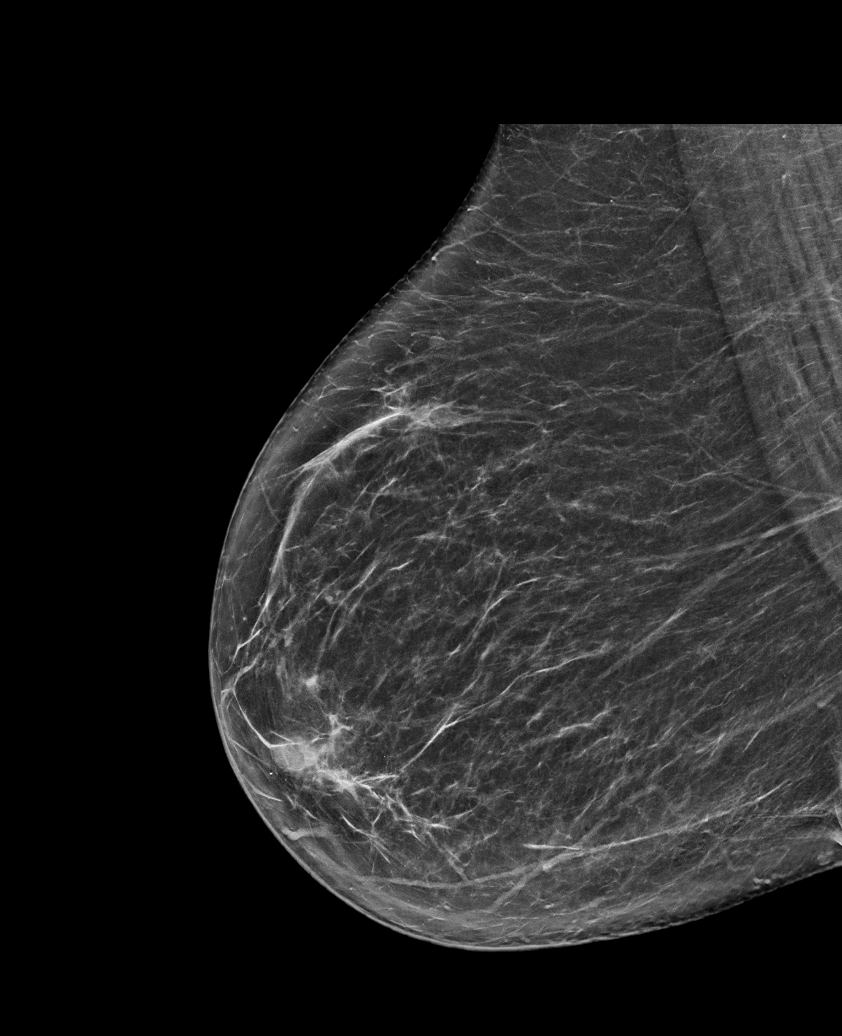

[L CC synth-2D]
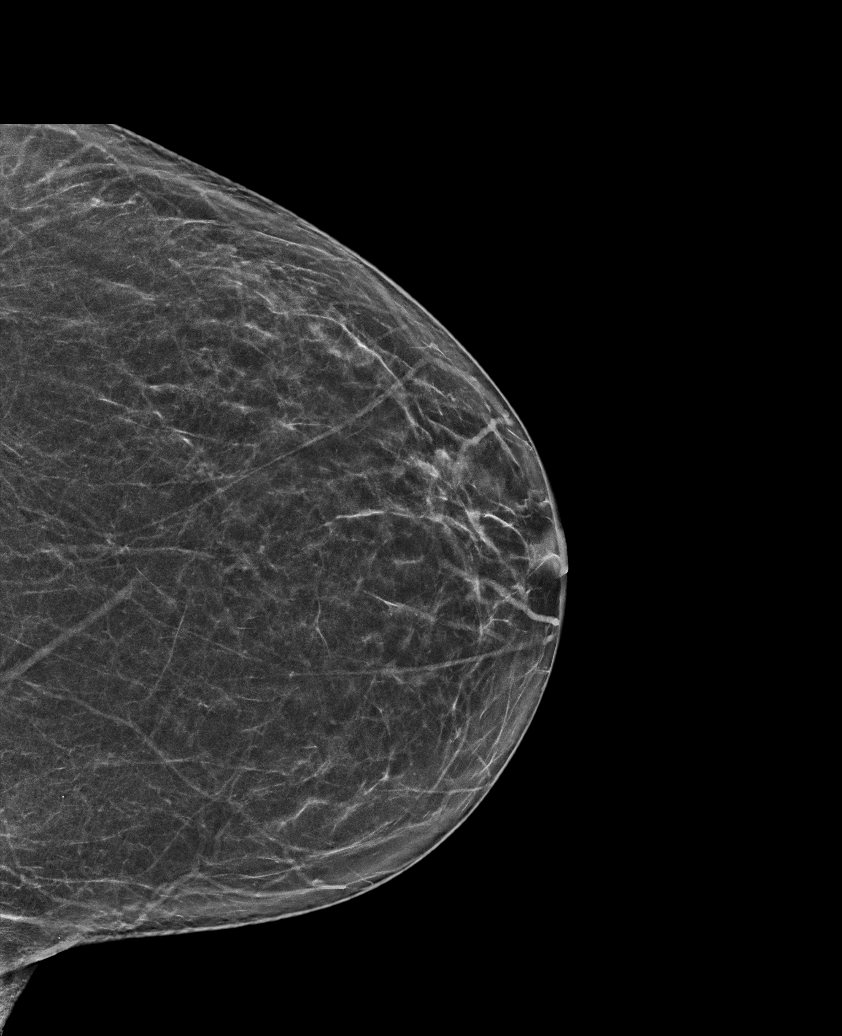

[R CC synth-2D (2 of 2)]
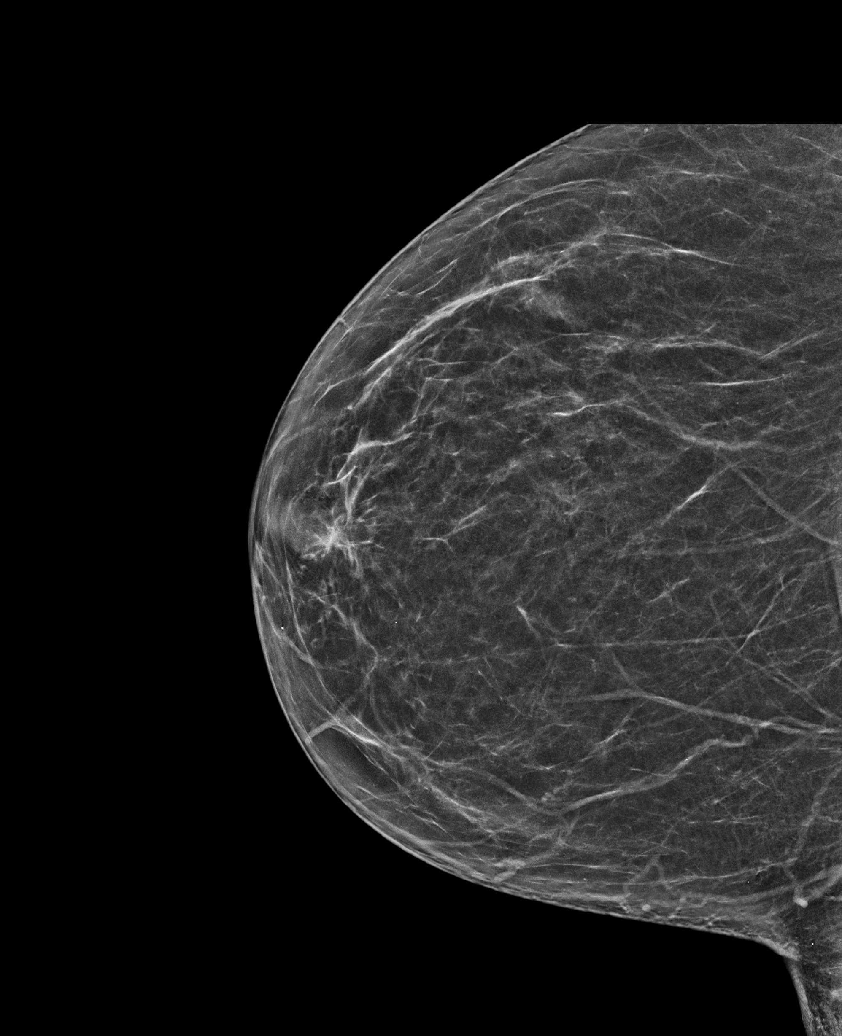

[L MLO synth-2D]
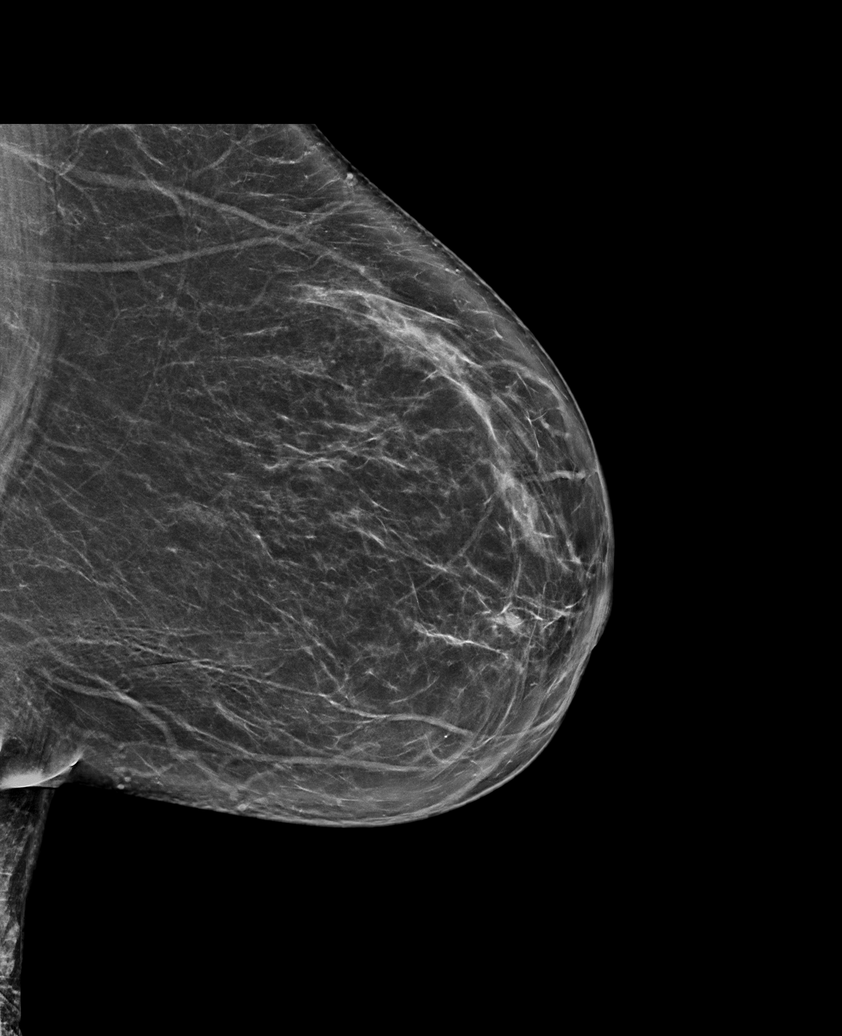

[R CC tomo · tomo slice 32/63.0]
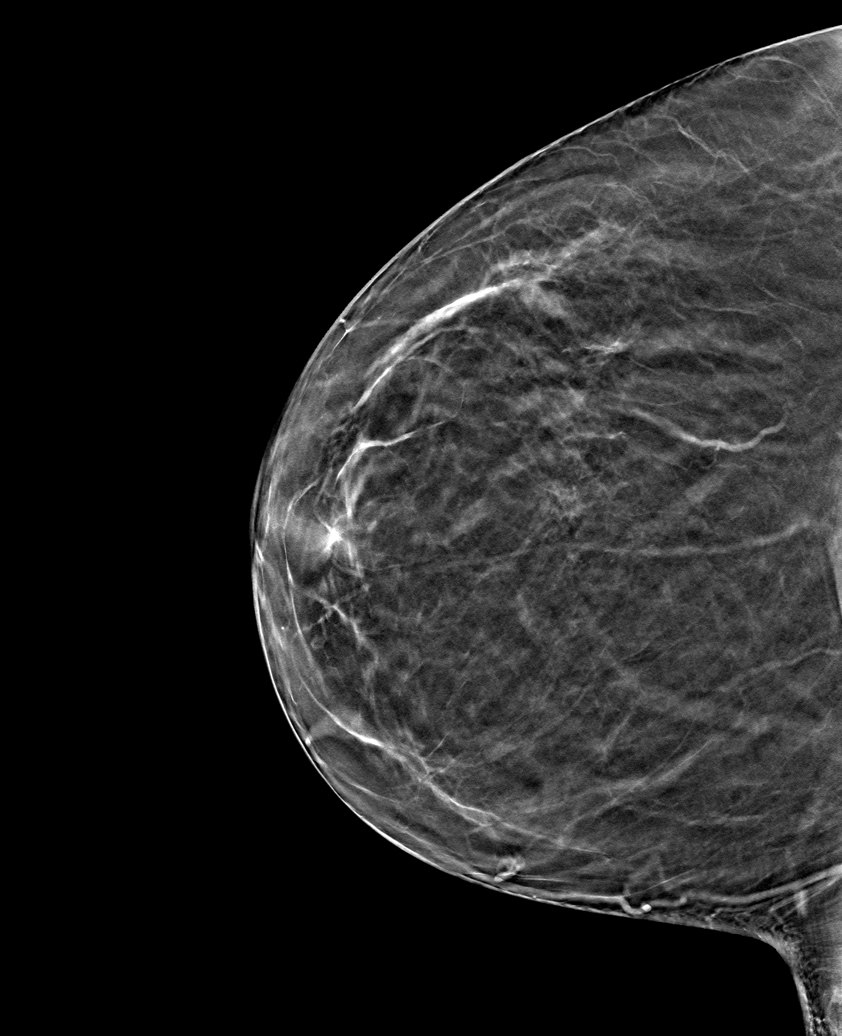

[6 of 30 positions shown; findings below may reference images not displayed]

ACR Breast Density Category b: There are scattered areas of
fibroglandular density.
FINDINGS: There are no findings suspicious for malignancy. Images were
processed with CAD.
IMPRESSION: No mammographic evidence of malignancy. A result letter of this
screening mammogram will be mailed directly to the patient.

RECOMMENDATION:
Screening mammogram in one year. (Code:CN-U-775)

BI-RADS CATEGORY  1: Negative.
